# Patient Record
Sex: Female | Born: 1955 | Race: White | Hispanic: No | Smoking: Never smoker
Health system: Southern US, Community
[De-identification: ages and names within clinical notes are randomized; demographics above are authoritative.]

## PROBLEM LIST (undated history)

## (undated) DIAGNOSIS — N179 Acute kidney failure, unspecified: Secondary | ICD-10-CM

## (undated) DIAGNOSIS — F411 Generalized anxiety disorder: Secondary | ICD-10-CM

## (undated) DIAGNOSIS — I10 Essential (primary) hypertension: Secondary | ICD-10-CM

## (undated) DIAGNOSIS — H269 Unspecified cataract: Secondary | ICD-10-CM

## (undated) DIAGNOSIS — I619 Nontraumatic intracerebral hemorrhage, unspecified: Secondary | ICD-10-CM

## (undated) DIAGNOSIS — R000 Tachycardia, unspecified: Secondary | ICD-10-CM

## (undated) DIAGNOSIS — E785 Hyperlipidemia, unspecified: Secondary | ICD-10-CM

## (undated) DIAGNOSIS — T7840XA Allergy, unspecified, initial encounter: Secondary | ICD-10-CM

## (undated) DIAGNOSIS — E669 Obesity, unspecified: Secondary | ICD-10-CM

## (undated) HISTORY — DX: Generalized anxiety disorder: F41.1

## (undated) HISTORY — DX: Acute kidney failure, unspecified: N17.9

## (undated) HISTORY — DX: Essential (primary) hypertension: I10

## (undated) HISTORY — DX: Nontraumatic intracerebral hemorrhage, unspecified: I61.9

## (undated) HISTORY — DX: Unspecified cataract: H26.9

## (undated) HISTORY — DX: Obesity, unspecified: E66.9

## (undated) HISTORY — PX: OTHER SURGICAL HISTORY: SHX169

## (undated) HISTORY — DX: Hyperlipidemia, unspecified: E78.5

## (undated) HISTORY — DX: Tachycardia, unspecified: R00.0

## (undated) HISTORY — PX: TUBAL LIGATION: SHX77

## (undated) HISTORY — DX: Allergy, unspecified, initial encounter: T78.40XA

---

## 1999-07-16 ENCOUNTER — Other Ambulatory Visit: Admission: RE | Admit: 1999-07-16 | Discharge: 1999-07-16 | Payer: Self-pay | Admitting: Obstetrics and Gynecology

## 1999-07-16 ENCOUNTER — Encounter (INDEPENDENT_AMBULATORY_CARE_PROVIDER_SITE_OTHER): Payer: Self-pay | Admitting: Specialist

## 2002-10-10 ENCOUNTER — Other Ambulatory Visit: Admission: RE | Admit: 2002-10-10 | Discharge: 2002-10-10 | Payer: Self-pay | Admitting: Obstetrics and Gynecology

## 2002-10-20 ENCOUNTER — Encounter: Admission: RE | Admit: 2002-10-20 | Discharge: 2002-10-20 | Payer: Self-pay | Admitting: Obstetrics and Gynecology

## 2002-10-20 ENCOUNTER — Encounter: Payer: Self-pay | Admitting: Obstetrics and Gynecology

## 2003-12-21 ENCOUNTER — Other Ambulatory Visit: Admission: RE | Admit: 2003-12-21 | Discharge: 2003-12-21 | Payer: Self-pay | Admitting: Obstetrics and Gynecology

## 2003-12-29 ENCOUNTER — Encounter: Admission: RE | Admit: 2003-12-29 | Discharge: 2003-12-29 | Payer: Self-pay | Admitting: Obstetrics and Gynecology

## 2007-02-09 ENCOUNTER — Other Ambulatory Visit: Admission: RE | Admit: 2007-02-09 | Discharge: 2007-02-09 | Payer: Self-pay | Admitting: Obstetrics and Gynecology

## 2007-02-15 ENCOUNTER — Encounter: Admission: RE | Admit: 2007-02-15 | Discharge: 2007-02-15 | Payer: Self-pay | Admitting: Obstetrics and Gynecology

## 2008-03-28 ENCOUNTER — Other Ambulatory Visit: Admission: RE | Admit: 2008-03-28 | Discharge: 2008-03-28 | Payer: Self-pay | Admitting: Obstetrics and Gynecology

## 2008-04-12 ENCOUNTER — Encounter: Admission: RE | Admit: 2008-04-12 | Discharge: 2008-04-12 | Payer: Self-pay | Admitting: Obstetrics and Gynecology

## 2012-02-02 ENCOUNTER — Emergency Department (HOSPITAL_COMMUNITY)
Admission: EM | Admit: 2012-02-02 | Discharge: 2012-02-02 | Disposition: A | Discharge status: Home / Self Care | Payer: BC Managed Care – PPO | Source: Home / Self Care

## 2012-02-02 ENCOUNTER — Encounter (HOSPITAL_COMMUNITY): Payer: Self-pay | Admitting: *Deleted

## 2012-02-02 DIAGNOSIS — S6440XA Injury of digital nerve of unspecified finger, initial encounter: Secondary | ICD-10-CM

## 2012-02-02 DIAGNOSIS — IMO0002 Reserved for concepts with insufficient information to code with codable children: Secondary | ICD-10-CM

## 2012-02-02 MED ORDER — TETANUS-DIPHTHERIA TOXOIDS TD 5-2 LFU IM INJ
0.5000 mL | INJECTION | Freq: Once | INTRAMUSCULAR | Status: AC
  Administered 2012-02-02: 0.5 mL via INTRAMUSCULAR

## 2012-02-02 MED ORDER — TETANUS-DIPHTHERIA TOXOIDS TD 5-2 LFU IM INJ
INJECTION | INTRAMUSCULAR | Status: AC
  Filled 2012-02-02: qty 0.5

## 2012-02-02 NOTE — ED Notes (Signed)
Reports cutting distal aspect left index finger on piece of broken glass while doing dishes this morning @ 0845.  No active bleeding.  Unk tetanus status.  Reports getting "very sick" after receiving pertussis booster several years ago.

## 2012-02-02 NOTE — Discharge Instructions (Signed)
Thank you for coming in today. Keep the finger clean and dry.  Change the dressing, and use antibiotic ointment with new dressings.  Please come back in about one week for suture removal. Alternatively you can go to your doctor for suture removal.

## 2012-02-02 NOTE — ED Provider Notes (Signed)
Medical screening examination/treatment/procedure(s) were performed by non-physician practitioner and as supervising physician I was immediately available for consultation/collaboration.  Rual Vermeer   Nolton Denis, MD 02/02/12 1948 

## 2012-02-02 NOTE — ED Provider Notes (Signed)
Susan Roman is a 56 y.o. female who presents to Urgent Care today for laceration of the left second fingertip. Patient was washing dishes today when she cut her finger on a broken piece of glass.  She feels well otherwise. She is not sure when her last tetanus shot was. She may have had a reaction to her pertussis shot past when she became ill several hours later.  She denies any chest pain palpitations lightheadedness or vision change.    PMH reviewed. Otherwise healthy History  Substance Use Topics  . Smoking status: Not on file  . Smokeless tobacco: Not on file  . Alcohol Use:    ROS as above Medications reviewed. Current Facility-Administered Medications  Medication Dose Route Frequency Provider Last Rate Last Dose  . tetanus & diphtheria toxoids (adult) (TENIVAC) injection 0.5 mL  0.5 mL Intramuscular Once Rodolph Bong, MD   0.5 mL at 02/02/12 1839   No current outpatient prescriptions on file.    Exam:  BP 106/70  Pulse 95  Temp(Src) 98.6 F (37 C) (Oral)  Resp 16  SpO2 96% Gen: Well NAD LEFT HAND LACERATION: Left second fingertip ulnar side laceration extending from the end of the fingernail approximately 1 cm proximally.  Laceration extends through the dermis.  The laceration bleeds well with exploration.  Normal hand and finger motion and strength.   Procedure note:  Consent obtained and timeout performed. A digital nerve block was applied to the base of the finger using 2 mL of 2% lidocaine without epinephrine.  I waited several minutes and then extensively irrigated and explored the wound.  Then I applied sterile dressing and field.  Using 4-0 Prolene 6 simple interrupted sutures were used to close the wound. A dressing was applied.   During the procedure patient became lightheaded. We laid the patient down and she recovered quickly and felt much better.   No results found for this or any previous visit (from the past 24 hour(s)). No results found.  Assessment  and Plan: 56 y.o. female with left second fingertip laceration.  Repaired with simple interrupted sutures. Plan to followup in one week for suture removals. Tetanus shot given today.  Handout on suture care provided. Patient expresses understanding.    cu  Rodolph Bong, MD 02/02/12 1905

## 2012-02-10 ENCOUNTER — Encounter (HOSPITAL_COMMUNITY): Payer: Self-pay | Admitting: *Deleted

## 2012-02-10 ENCOUNTER — Emergency Department (INDEPENDENT_AMBULATORY_CARE_PROVIDER_SITE_OTHER)
Admission: EM | Admit: 2012-02-10 | Discharge: 2012-02-10 | Disposition: A | Discharge status: Home / Self Care | Payer: Self-pay | Source: Home / Self Care | Attending: Emergency Medicine | Admitting: Emergency Medicine

## 2012-02-10 DIAGNOSIS — Z4802 Encounter for removal of sutures: Secondary | ICD-10-CM

## 2012-02-10 NOTE — ED Notes (Signed)
herte  For  Suture  Removal     -  Sutures  Have  Been in place  For  8  Days       - pt  Also  Received  A  Tet  Shot  -  Sutures  Appear  To be  Healing

## 2012-02-10 NOTE — Discharge Instructions (Signed)
Suture Removal  Your caregiver has removed your sutures today. If skin adhesive strips were applied at the time of suturing, or applied following removal of the sutures today, they will begin to peel off in a couple more days. If skin adhesive strips remain after 14 days, they may be removed.  HOME CARE INSTRUCTIONS     Change any bandages (dressings) at least once a day or as directed by your caregiver. If the bandage sticks, soak it off with warm, soapy water.    Wash the area with soap and water to remove all the cream or ointment (if you were instructed to use any) 2 times a day. Rinse off the soap and pat the area dry with a clean towel.    Reapply cream or ointment as directed by your caregiver. This will help prevent infection and keep the bandage from sticking.    Keep the wound area dry and clean. If the bandage becomes wet, dirty, or develops a bad smell, change it as soon as possible.    Only take over-the-counter or prescription medicines for pain, discomfort, or fever as directed by your caregiver.    Use sunscreen when out in the sun. New scars become sunburned easily.    Return to your caregivers office in in 7 days or as directed to have your sutures removed.   You may need a tetanus shot if:   You cannot remember when you had your last tetanus shot.    You have never had a tetanus shot.    The injury broke your skin.   If you got a tetanus shot, your arm may swell, get red, and feel warm to the touch. This is common and not a problem. If you need a tetanus shot and you choose not to have one, there is a rare chance of getting tetanus. Sickness from tetanus can be serious.  SEEK IMMEDIATE MEDICAL CARE IF:     There is redness, swelling, or increasing pain in the wound.    Pus is coming from the wound.    An unexplained oral temperature above 102 F (38.9 C) develops.    You notice a bad smell coming from the wound or dressing.     The wound breaks open (edges not staying together) after sutures have been removed.   Document Released: 05/13/2001 Document Revised: 08/07/2011 Document Reviewed: 07/12/2007  Exit[employer] Patient Information 2012 [employer].

## 2012-02-10 NOTE — ED Provider Notes (Signed)
History     CSN: [id number]  Arrival date & time 02/10/12  1801   First MD Initiated Contact with Patient 02/10/12 1832      Chief Complaint  Patient presents with  . Suture / Staple Removal    (Consider location/radiation/quality/duration/timing/severity/associated sxs/prior treatment) Patient is a 56 y.o. female presenting with suture removal. The history is provided by the patient.  Suture / Staple Removal  The sutures were placed 7 to 10 days ago. Treatments since wound repair include antibiotic ointment use. There has been no drainage from the wound. There is no redness present. There is no swelling present.   patient denies any complaints  No past medical history on file.  Past Surgical History  Procedure Date  . Fibroid tumor     No family history on file.  History  Substance Use Topics  . Smoking status: Not on file  . Smokeless tobacco: Not on file  . Alcohol Use:     OB History    Grav Para Term Preterm Abortions TAB SAB Ect Mult Living                  Review of Systems  All other systems reviewed and are negative.    Allergies  Review of patient's allergies indicates no known allergies.  Home Medications  No current outpatient prescriptions on file.  BP 179/100  Pulse 90  Temp(Src) 98.9 F (37.2 C) (Oral)  Resp 18  SpO2 100%  Physical Exam  Vitals reviewed. Constitutional: She is oriented to person, place, and time. She appears well-developed and well-nourished.  Musculoskeletal: Normal range of motion.       Healing laceration left second finger tip no sign of infection  Neurological: She is alert and oriented to person, place, and time.  Skin: Skin is warm and dry.  Psychiatric: She has a normal mood and affect.    ED Course  Procedures (including critical care time)  Labs Reviewed - No data to display No results found.   1. Visit for suture removal       MDM  Sutures removed by Emt patient advised to return if any  problems        Elson Areas, [place] 02/10/12 1834  Lonia Skinner [place], [place] 02/10/12 1836

## 2012-02-10 NOTE — ED Provider Notes (Signed)
Medical screening examination/treatment/procedure(s) were performed by non-physician practitioner and as supervising physician I was immediately available for consultation/collaboration.  Pamalee Marcoe, M.D.   Jeziah Kretschmer C Alvar Malinoski, MD 02/10/12 2125 

## 2018-03-29 ENCOUNTER — Encounter: Payer: Self-pay | Admitting: Gastroenterology

## 2018-04-22 ENCOUNTER — Other Ambulatory Visit: Payer: Self-pay | Admitting: Internal Medicine

## 2018-04-22 DIAGNOSIS — Z1231 Encounter for screening mammogram for malignant neoplasm of breast: Secondary | ICD-10-CM

## 2018-05-07 ENCOUNTER — Encounter: Payer: Self-pay | Admitting: *Deleted

## 2018-05-13 ENCOUNTER — Encounter: Payer: Self-pay | Admitting: Gastroenterology

## 2018-05-18 ENCOUNTER — Ambulatory Visit
Admission: RE | Admit: 2018-05-18 | Discharge: 2018-05-18 | Disposition: A | Discharge status: Home / Self Care | Payer: BLUE CROSS/BLUE SHIELD | Source: Ambulatory Visit | Attending: Internal Medicine | Admitting: Internal Medicine

## 2018-05-18 DIAGNOSIS — Z1231 Encounter for screening mammogram for malignant neoplasm of breast: Secondary | ICD-10-CM

## 2018-06-07 ENCOUNTER — Encounter: Payer: Self-pay | Admitting: *Deleted

## 2018-06-07 ENCOUNTER — Telehealth: Payer: Self-pay | Admitting: *Deleted

## 2018-06-07 NOTE — Telephone Encounter (Signed)
Pt left message today stating that she had been on hold with our office for over 30 minutes. She wants to cancel her appt on 10/9. I responded to pt via MyChart.

## 2018-06-09 ENCOUNTER — Ambulatory Visit: Payer: Self-pay | Admitting: Obstetrics and Gynecology

## 2018-07-05 ENCOUNTER — Other Ambulatory Visit (HOSPITAL_COMMUNITY)
Admission: RE | Admit: 2018-07-05 | Discharge: 2018-07-05 | Disposition: A | Discharge status: Home / Self Care | Payer: BLUE CROSS/BLUE SHIELD | Source: Ambulatory Visit | Attending: Physician Assistant | Admitting: Physician Assistant

## 2018-07-05 ENCOUNTER — Other Ambulatory Visit: Payer: Self-pay | Admitting: Physician Assistant

## 2018-07-05 DIAGNOSIS — Z0000 Encounter for general adult medical examination without abnormal findings: Secondary | ICD-10-CM | POA: Insufficient documentation

## 2018-07-06 ENCOUNTER — Other Ambulatory Visit: Payer: Self-pay | Admitting: Physician Assistant

## 2018-07-06 DIAGNOSIS — Z13820 Encounter for screening for osteoporosis: Secondary | ICD-10-CM

## 2018-07-07 LAB — CYTOLOGY - PAP
Diagnosis: NEGATIVE
HPV: NOT DETECTED

## 2018-07-26 ENCOUNTER — Other Ambulatory Visit: Payer: BLUE CROSS/BLUE SHIELD

## 2018-08-09 ENCOUNTER — Ambulatory Visit
Admission: RE | Admit: 2018-08-09 | Discharge: 2018-08-09 | Disposition: A | Discharge status: Home / Self Care | Payer: PRIVATE HEALTH INSURANCE | Source: Ambulatory Visit | Attending: Physician Assistant | Admitting: Physician Assistant

## 2018-08-09 DIAGNOSIS — Z13820 Encounter for screening for osteoporosis: Secondary | ICD-10-CM

## 2019-05-03 IMAGING — MG DIGITAL SCREENING BILATERAL MAMMOGRAM WITH TOMO AND CAD
8 series · 8 of 24 positions shown · non-contrast
Comparison: Previous exam(s).

CLINICAL DATA: Screening.

EXAM:
DIGITAL SCREENING BILATERAL MAMMOGRAM WITH TOMO AND CAD

[R MLO synth-2D]
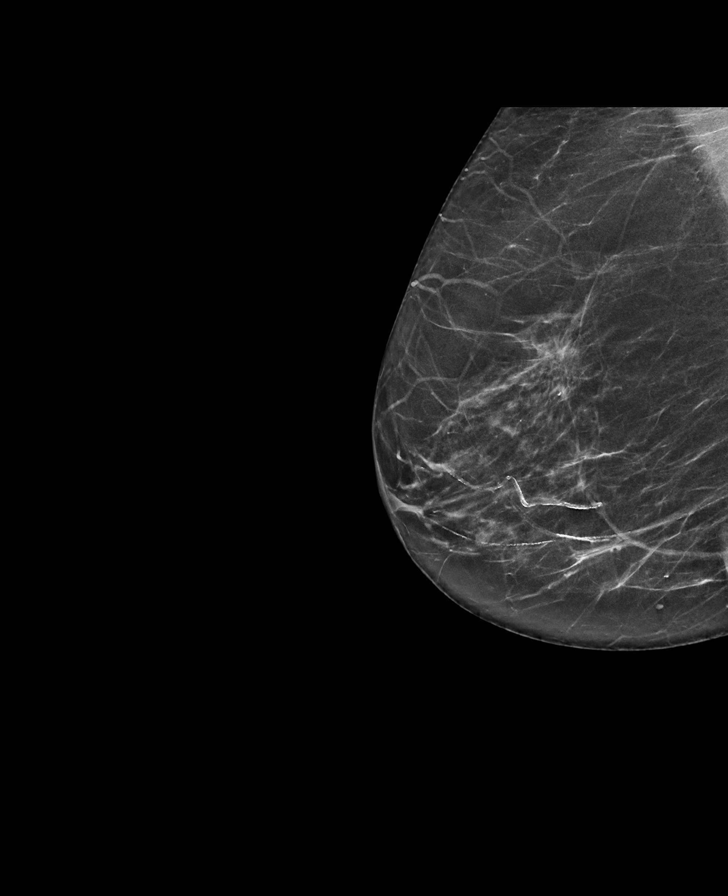

[L CC synth-2D]
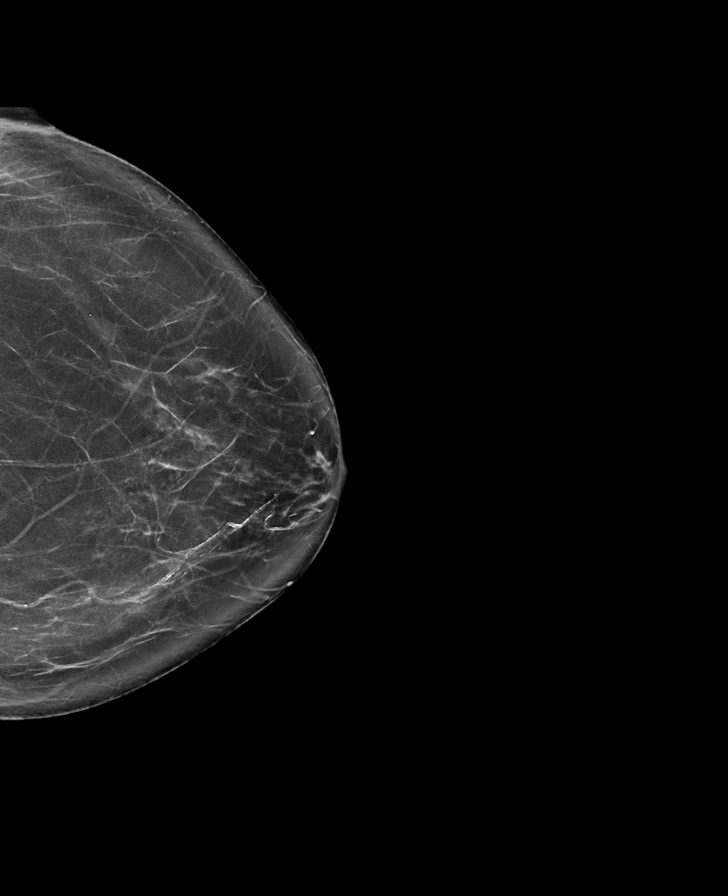

[L MLO synth-2D]
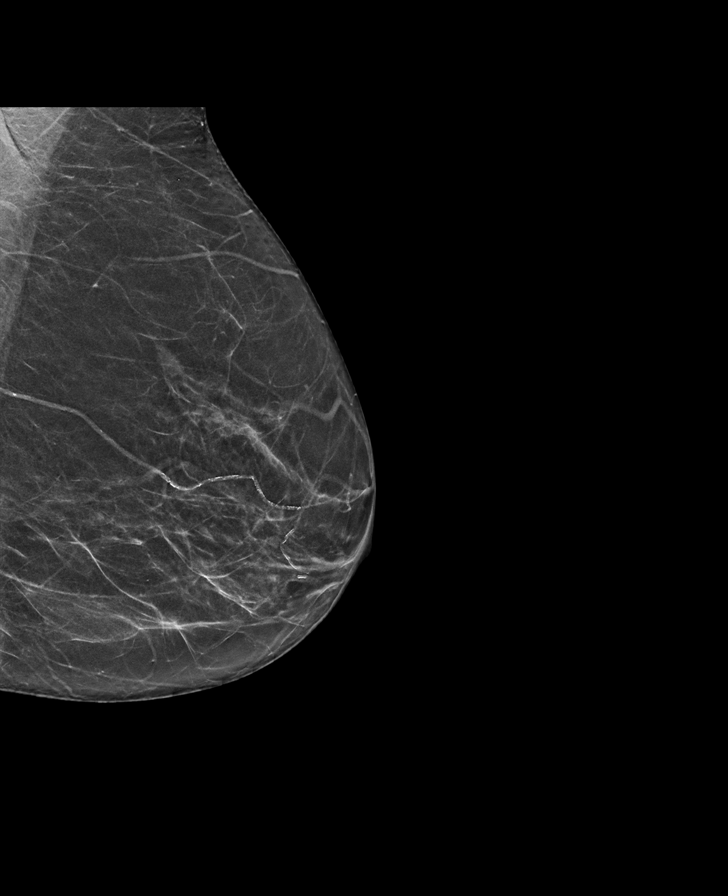

[R CC synth-2D]
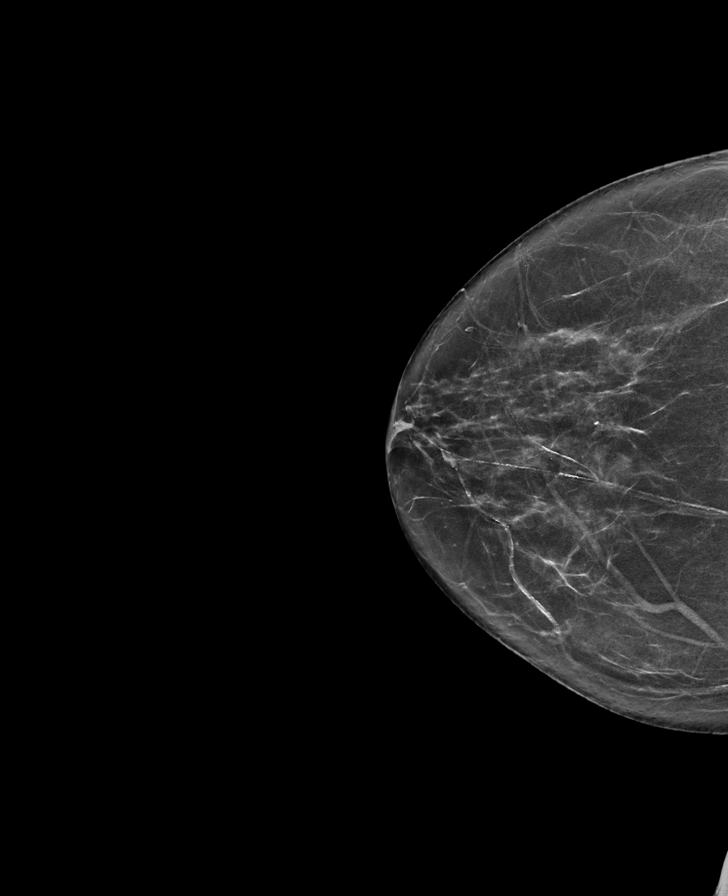

[L MLO tomo · tomo slice 31/62.0]
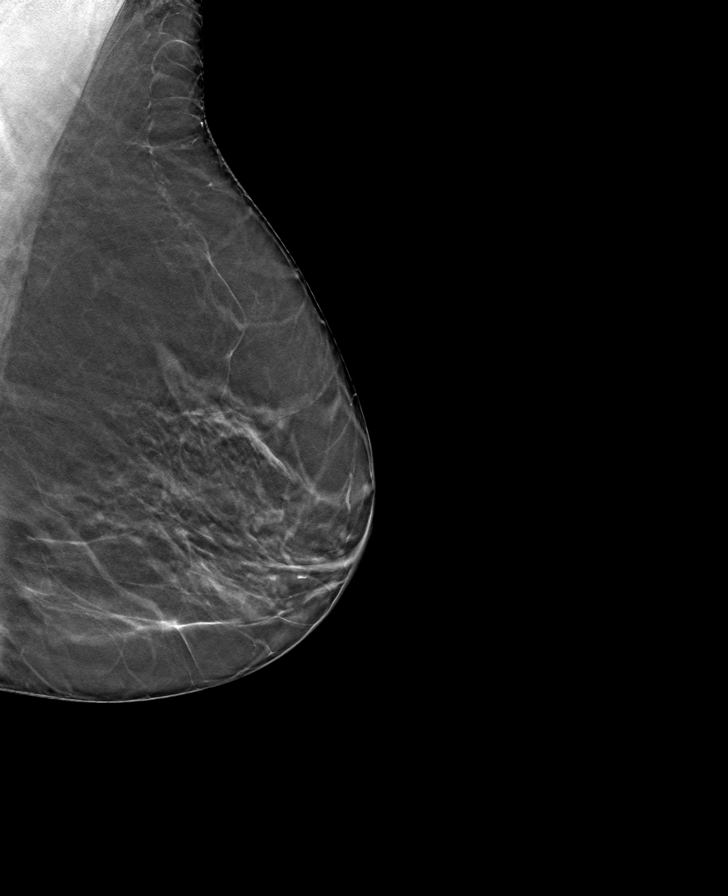

[R CC tomo · tomo slice 33/65.0]
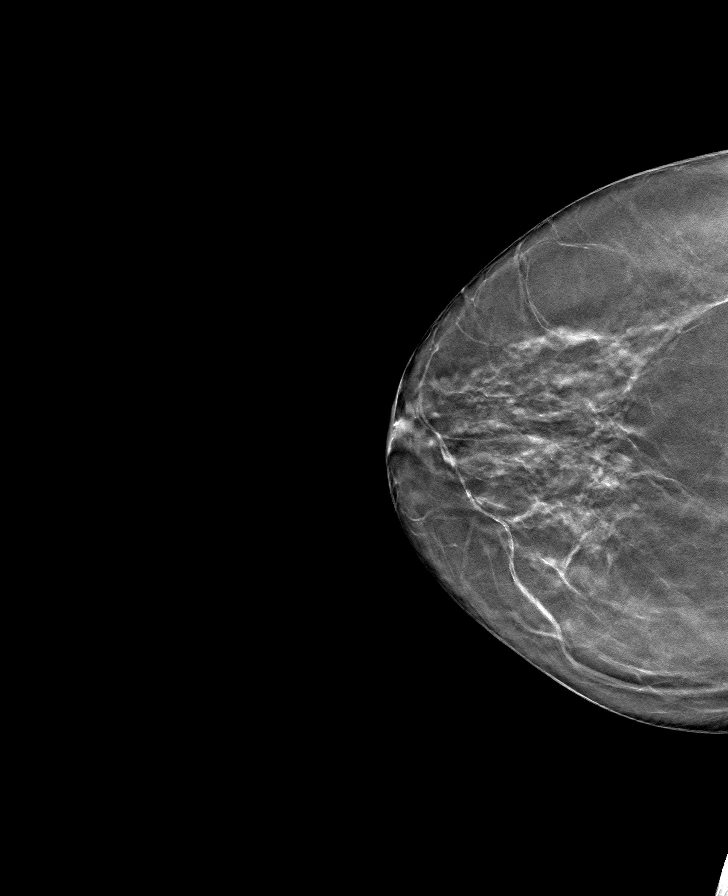

[R MLO tomo · tomo slice 33/64.0]
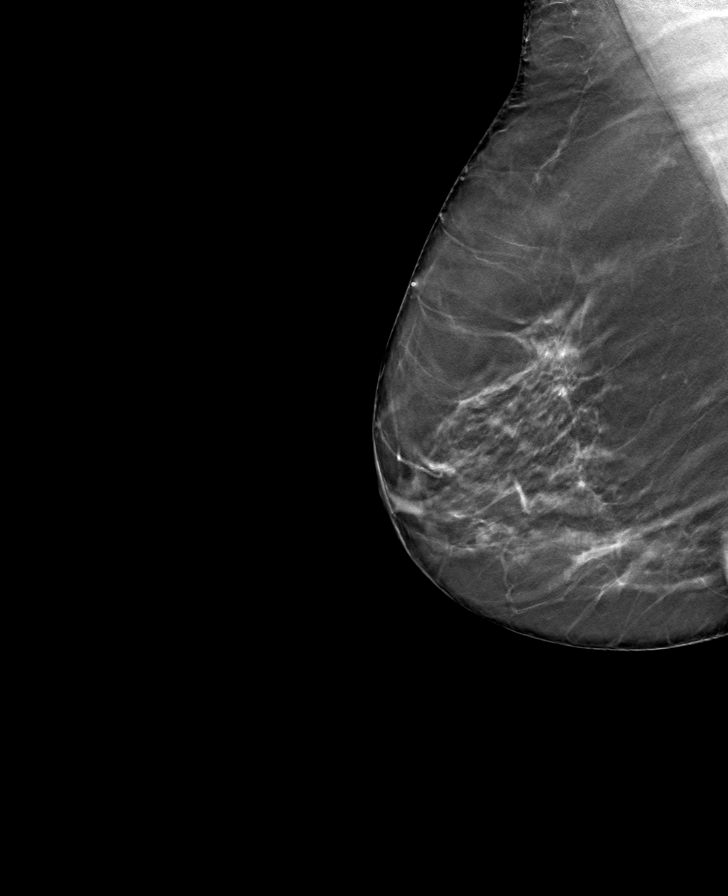

[L CC tomo · tomo slice 36/71.0]
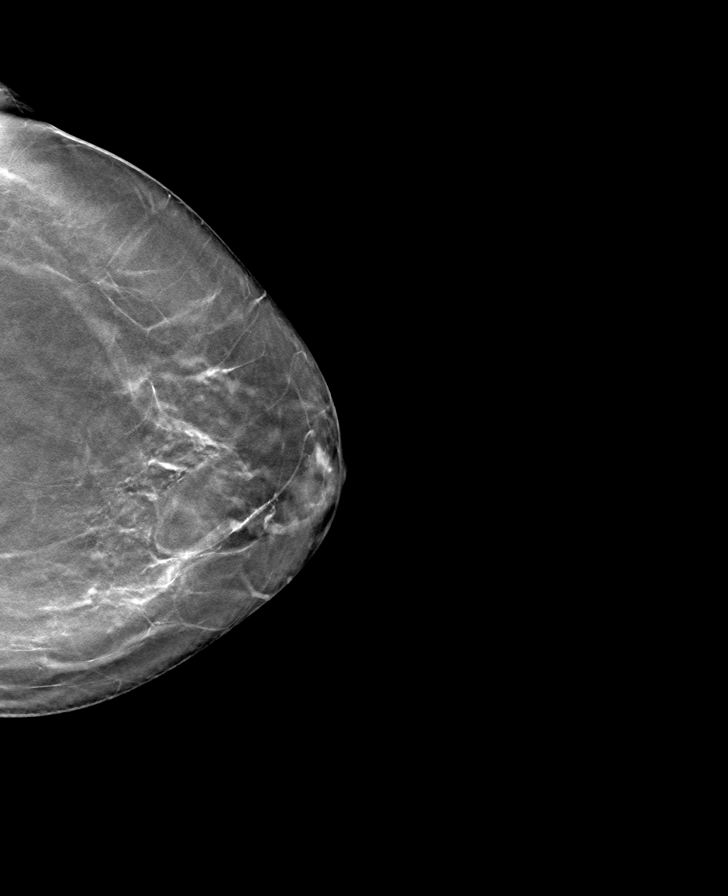

[8 of 24 positions shown; findings below may reference images not displayed]

ACR Breast Density Category b: There are scattered areas of
fibroglandular density.
FINDINGS: There are no findings suspicious for malignancy. Images were
processed with CAD.
IMPRESSION: No mammographic evidence of malignancy. A result letter of this
screening mammogram will be mailed directly to the patient.

RECOMMENDATION:
Screening mammogram in one year. (Code:CN-U-775)

BI-RADS CATEGORY  1: Negative.

## 2019-12-30 ENCOUNTER — Ambulatory Visit: Payer: PRIVATE HEALTH INSURANCE

## 2020-10-23 DIAGNOSIS — L570 Actinic keratosis: Secondary | ICD-10-CM | POA: Diagnosis not present

## 2020-10-23 DIAGNOSIS — L738 Other specified follicular disorders: Secondary | ICD-10-CM | POA: Diagnosis not present

## 2020-10-23 DIAGNOSIS — L821 Other seborrheic keratosis: Secondary | ICD-10-CM | POA: Diagnosis not present

## 2020-10-23 DIAGNOSIS — L814 Other melanin hyperpigmentation: Secondary | ICD-10-CM | POA: Diagnosis not present

## 2021-01-16 DIAGNOSIS — I10 Essential (primary) hypertension: Secondary | ICD-10-CM | POA: Diagnosis not present

## 2021-05-23 DIAGNOSIS — I10 Essential (primary) hypertension: Secondary | ICD-10-CM | POA: Diagnosis not present

## 2021-07-02 DIAGNOSIS — D225 Melanocytic nevi of trunk: Secondary | ICD-10-CM | POA: Diagnosis not present

## 2021-07-02 DIAGNOSIS — L820 Inflamed seborrheic keratosis: Secondary | ICD-10-CM | POA: Diagnosis not present

## 2021-07-02 DIAGNOSIS — D1801 Hemangioma of skin and subcutaneous tissue: Secondary | ICD-10-CM | POA: Diagnosis not present

## 2021-07-02 DIAGNOSIS — L821 Other seborrheic keratosis: Secondary | ICD-10-CM | POA: Diagnosis not present

## 2021-07-02 DIAGNOSIS — L570 Actinic keratosis: Secondary | ICD-10-CM | POA: Diagnosis not present

## 2024-06-25 ENCOUNTER — Inpatient Hospital Stay (HOSPITAL_COMMUNITY)

## 2024-06-25 ENCOUNTER — Other Ambulatory Visit: Payer: Self-pay

## 2024-06-25 ENCOUNTER — Encounter (HOSPITAL_COMMUNITY): Payer: Self-pay | Admitting: Pharmacy Technician

## 2024-06-25 ENCOUNTER — Inpatient Hospital Stay (HOSPITAL_COMMUNITY)
Admission: EM | Admit: 2024-06-25 | Discharge: 2024-06-28 | DRG: 304 | Disposition: A | Discharge status: Home / Self Care | Attending: Neurology | Admitting: Neurology

## 2024-06-25 ENCOUNTER — Emergency Department (HOSPITAL_COMMUNITY)

## 2024-06-25 DIAGNOSIS — I739 Peripheral vascular disease, unspecified: Secondary | ICD-10-CM | POA: Diagnosis not present

## 2024-06-25 DIAGNOSIS — I10 Essential (primary) hypertension: Secondary | ICD-10-CM | POA: Diagnosis present

## 2024-06-25 DIAGNOSIS — I619 Nontraumatic intracerebral hemorrhage, unspecified: Secondary | ICD-10-CM

## 2024-06-25 DIAGNOSIS — I161 Hypertensive emergency: Secondary | ICD-10-CM | POA: Diagnosis present

## 2024-06-25 DIAGNOSIS — E785 Hyperlipidemia, unspecified: Secondary | ICD-10-CM | POA: Insufficient documentation

## 2024-06-25 DIAGNOSIS — R000 Tachycardia, unspecified: Secondary | ICD-10-CM | POA: Diagnosis not present

## 2024-06-25 DIAGNOSIS — Z23 Encounter for immunization: Secondary | ICD-10-CM | POA: Diagnosis present

## 2024-06-25 DIAGNOSIS — G936 Cerebral edema: Secondary | ICD-10-CM | POA: Diagnosis present

## 2024-06-25 DIAGNOSIS — N179 Acute kidney failure, unspecified: Secondary | ICD-10-CM | POA: Insufficient documentation

## 2024-06-25 DIAGNOSIS — I471 Supraventricular tachycardia, unspecified: Secondary | ICD-10-CM | POA: Diagnosis present

## 2024-06-25 DIAGNOSIS — Z635 Disruption of family by separation and divorce: Secondary | ICD-10-CM | POA: Diagnosis not present

## 2024-06-25 DIAGNOSIS — I611 Nontraumatic intracerebral hemorrhage in hemisphere, cortical: Secondary | ICD-10-CM | POA: Diagnosis present

## 2024-06-25 DIAGNOSIS — R4701 Aphasia: Secondary | ICD-10-CM | POA: Diagnosis present

## 2024-06-25 DIAGNOSIS — I6389 Other cerebral infarction: Secondary | ICD-10-CM | POA: Diagnosis not present

## 2024-06-25 DIAGNOSIS — R29703 NIHSS score 3: Secondary | ICD-10-CM | POA: Diagnosis present

## 2024-06-25 DIAGNOSIS — I639 Cerebral infarction, unspecified: Secondary | ICD-10-CM

## 2024-06-25 DIAGNOSIS — H53461 Homonymous bilateral field defects, right side: Secondary | ICD-10-CM | POA: Diagnosis present

## 2024-06-25 HISTORY — DX: Cerebral infarction, unspecified: I63.9

## 2024-06-25 HISTORY — DX: Nontraumatic intracerebral hemorrhage, unspecified: I61.9

## 2024-06-25 LAB — COMPREHENSIVE METABOLIC PANEL WITH GFR
ALT: 14 U/L (ref 0–44)
AST: 22 U/L (ref 15–41)
Albumin: 4 g/dL (ref 3.5–5.0)
Alkaline Phosphatase: 77 U/L (ref 38–126)
Anion gap: 8 (ref 5–15)
BUN: 13 mg/dL (ref 8–23)
CO2: 27 mmol/L (ref 22–32)
Calcium: 9.1 mg/dL (ref 8.9–10.3)
Chloride: 105 mmol/L (ref 98–111)
Creatinine, Ser: 1.06 mg/dL — ABNORMAL HIGH (ref 0.44–1.00)
GFR, Estimated: 57 mL/min — ABNORMAL LOW (ref >60)
Glucose, Bld: 140 mg/dL — ABNORMAL HIGH (ref 70–99)
Potassium: 4.9 mmol/L (ref 3.5–5.1)
Sodium: 140 mmol/L (ref 135–145)
Total Bilirubin: 0.9 mg/dL (ref 0.0–1.2)
Total Protein: 6.6 g/dL (ref 6.5–8.1)

## 2024-06-25 LAB — CBC
HCT: 42.9 % (ref 36.0–46.0)
Hemoglobin: 14.1 g/dL (ref 12.0–15.0)
MCH: 31.4 pg (ref 26.0–34.0)
MCHC: 32.9 g/dL (ref 30.0–36.0)
MCV: 95.5 fL (ref 80.0–100.0)
Platelets: 245 K/uL (ref 150–400)
RBC: 4.49 MIL/uL (ref 3.87–5.11)
RDW: 12.5 % (ref 11.5–15.5)
WBC: 7.9 K/uL (ref 4.0–10.5)
nRBC: 0 % (ref 0.0–0.2)

## 2024-06-25 LAB — URINALYSIS, ROUTINE W REFLEX MICROSCOPIC
Bilirubin Urine: NEGATIVE
Glucose, UA: NEGATIVE mg/dL
Hgb urine dipstick: NEGATIVE
Ketones, ur: NEGATIVE mg/dL
Leukocytes,Ua: NEGATIVE
Nitrite: NEGATIVE
Protein, ur: NEGATIVE mg/dL
Specific Gravity, Urine: 1.002 — ABNORMAL LOW (ref 1.005–1.030)
pH: 6 (ref 5.0–8.0)

## 2024-06-25 LAB — I-STAT CHEM 8, ED
BUN: 14 mg/dL (ref 8–23)
Calcium, Ion: 1.11 mmol/L — ABNORMAL LOW (ref 1.15–1.40)
Chloride: 105 mmol/L (ref 98–111)
Creatinine, Ser: 1.1 mg/dL — ABNORMAL HIGH (ref 0.44–1.00)
Glucose, Bld: 103 mg/dL — ABNORMAL HIGH (ref 70–99)
HCT: 40 % (ref 36.0–46.0)
Hemoglobin: 13.6 g/dL (ref 12.0–15.0)
Potassium: 3.7 mmol/L (ref 3.5–5.1)
Sodium: 144 mmol/L (ref 135–145)
TCO2: 27 mmol/L (ref 22–32)

## 2024-06-25 LAB — CBG MONITORING, ED: Glucose-Capillary: 150 mg/dL — ABNORMAL HIGH (ref 70–99)

## 2024-06-25 MED ORDER — ACETAMINOPHEN 325 MG PO TABS: 650.0000 mg | ORAL_TABLET | ORAL | Status: DC | PRN

## 2024-06-25 MED ORDER — IOHEXOL 350 MG/ML SOLN
75.0000 mL | Freq: Once | INTRAVENOUS | Status: AC | PRN
  Administered 2024-06-25: 75 mL via INTRAVENOUS

## 2024-06-25 MED ORDER — SENNOSIDES-DOCUSATE SODIUM 8.6-50 MG PO TABS
1.0000 | ORAL_TABLET | Freq: Two times a day (BID) | ORAL | Status: DC
  Administered 2024-06-25 – 2024-06-26 (×2): 1 via ORAL
  Filled 2024-06-25 (×3): qty 1

## 2024-06-25 MED ORDER — STROKE: EARLY STAGES OF RECOVERY BOOK
Freq: Once | Status: AC
  Filled 2024-06-25: qty 1

## 2024-06-25 MED ORDER — INFLUENZA VAC SPLIT HIGH-DOSE 0.5 ML IM SUSY: 0.5000 mL | PREFILLED_SYRINGE | INTRAMUSCULAR | Status: DC | PRN

## 2024-06-25 MED ORDER — CLEVIDIPINE BUTYRATE 0.5 MG/ML IV EMUL
0.0000 mg/h | INTRAVENOUS | Status: DC
  Administered 2024-06-25: 2 mg/h via INTRAVENOUS
  Administered 2024-06-25: 8 mg/h via INTRAVENOUS
  Administered 2024-06-25 (×2): 12 mg/h via INTRAVENOUS
  Administered 2024-06-25: 4 mg/h via INTRAVENOUS
  Filled 2024-06-25: qty 100
  Filled 2024-06-25: qty 50

## 2024-06-25 MED ORDER — ACETAMINOPHEN 160 MG/5ML PO SOLN: 650.0000 mg | ORAL | Status: DC | PRN

## 2024-06-25 MED ORDER — LABETALOL HCL 5 MG/ML IV SOLN
20.0000 mg | Freq: Once | INTRAVENOUS | Status: AC
  Administered 2024-06-25: 20 mg via INTRAVENOUS
  Filled 2024-06-25: qty 4

## 2024-06-25 MED ORDER — PNEUMOCOCCAL 20-VAL CONJ VACC 0.5 ML IM SUSY
0.5000 mL | PREFILLED_SYRINGE | INTRAMUSCULAR | Status: AC | PRN
  Administered 2024-06-26: 0.5 mL via INTRAMUSCULAR
  Filled 2024-06-25: qty 0.5

## 2024-06-25 MED ORDER — NICARDIPINE HCL IN NACL 20-0.86 MG/200ML-% IV SOLN
0.0000 mg/h | INTRAVENOUS | Status: DC
  Filled 2024-06-25: qty 200

## 2024-06-25 MED ORDER — ACETAMINOPHEN 650 MG RE SUPP: 650.0000 mg | RECTAL | Status: DC | PRN

## 2024-06-25 MED ORDER — PANTOPRAZOLE SODIUM 40 MG IV SOLR
40.0000 mg | Freq: Every day | INTRAVENOUS | Status: DC
  Administered 2024-06-26: 40 mg via INTRAVENOUS
  Filled 2024-06-25: qty 10

## 2024-06-25 NOTE — Progress Notes (Signed)
 This SRN was contact by Neuro MD in assisting ED RN with BP management. SBP Goal 130-150. Cleviprex titrated per order. Patients family expressed concern over worsening forgetfulness; This SRN's assessment showed worsening aphasia. Neuro MD ordered stat CT Head. This RN assisted ED RN to CT and back to patients room.   Patient was transferred to 4N ICU when bed was ready. This SRN assessed patient and no increase in NIH was noted.

## 2024-06-25 NOTE — H&P (Addendum)
 NEUROLOGY H&P NOTE   Date of service: June 25, 2024 Patient Name: Susan Roman MRN:  [id number] DOB:  [birth year] Chief Complaint: Left posterior MCA ICH  History of Present Illness  Susan Roman is a 68 y.o. female with hx of hypertension, does not see doctors regularly who presents with word finding difficulty over the last day.  She has been noticing the symptoms since at least yesterday morning.  She was noted to be hypertensive on arrival to the ED with systolic consistently over 200s.  She had a CT head without contrast which was notable for a posterior left temporal and parietal lobe intraparenchymal hemorrhage measuring about 5.1 mL with surrounding mild vasogenic edema.  CT angio the head and neck is negative for large vessel occlusion or vascular malformation or aneurysm or spot sign.  Neurology was consulted for further evaluation workup.  Patient is not on any anticoagulation or on any antiplatelet.  Last known well: Unclear.  Presume at least early morning on 06/20/2024 Modified rankin score: 0-Completely asymptomatic and back to baseline post- stroke ICH Score: 0 tNKASE: Not offered due to ICH Thrombectomy: not offered due to ICH NIHSS components Score: Comment  1a Level of Conscious 0[x]  1[]  2[]  3[]      1b LOC Questions 0[x]  1[]  2[]       1c LOC Commands 0[x]  1[]  2[]       2 Best Gaze 0[x]  1[]  2[]       3 Visual 0[]  1[]  2[x]  3[]      4 Facial Palsy 0[x]  1[]  2[]  3[]      5a Motor Arm - left 0[x]  1[]  2[]  3[]  4[]  UN[]    5b Motor Arm - Right 0[x]  1[]  2[]  3[]  4[]  UN[]    6a Motor Leg - Left 0[x]  1[]  2[]  3[]  4[]  UN[]    6b Motor Leg - Right 0[x]  1[]  2[]  3[]  4[]  UN[]    7 Limb Ataxia 0[x]  1[]  2[]  UN[]      8 Sensory 0[x]  1[]  2[]  UN[]      9 Best Language 0[]  1[x]  2[]  3[]      10 Dysarthria 0[x]  1[]  2[]  UN[]      11 Extinct. and Inattention 0[x]  1[]  2[]       TOTAL: 3      ROS  Comprehensive ROS performed and pertinent positives documented in the HPI.  Past History   History reviewed. No pertinent past medical history. Past Surgical History:  Procedure Laterality Date   fibroid tumor     History reviewed. No pertinent family history. Social History   Socioeconomic History   Marital status: Legally Separated    Spouse name: Not on file   Number of children: Not on file   Years of education: Not on file   Highest education level: Not on file  Occupational History   Not on file  Tobacco Use   Smoking status: Not on file   Smokeless tobacco: Not on file  Substance and Sexual Activity   Alcohol use: Not on file   Drug use: No   Sexual activity: Not on file  Other Topics Concern   Not on file  Social History Narrative   Not on file   Social Drivers of Health   Financial Resource Strain: Not on file  Food Insecurity: Not on file  Transportation Needs: Not on file  Physical Activity: Not on file  Stress: Not on file  Social Connections: Not on file   No Known Allergies  Medications  (Not in a hospital admission)    Vitals  Vitals:   06/25/24 1300 06/25/24 1400 06/25/24 1440 06/25/24 1450  BP: (!) 202/109 (!) 197/103 (!) 203/97 (!) 197/109  Pulse: (!) 113 (!) 113 96 94  Resp: 16 16 16 16   Temp:      TempSrc:      SpO2: 100% 98% 98% 97%     There is no height or weight on file to calculate BMI.  Physical Exam   General: Laying comfortably in bed; in no acute distress.  HENT: Normal oropharynx and mucosa. Normal external appearance of ears and nose.  Neck: Supple, no pain or tenderness  CV: No JVD. No peripheral edema.  Pulmonary: Symmetric Chest rise. Normal respiratory effort.  Abdomen: Soft to touch, non-tender.  Ext: No cyanosis, edema, or deformity  Skin: No rash. Normal palpation of skin.   Musculoskeletal: Normal digits and nails by inspection. No clubbing.   Neurologic Examination  Mental status/Cognition: Alert, oriented to self, place, month and year, good attention.  Speech/language: Fluent, comprehension  intact, get stuck on some words and makes paraphasic errors, repetition intact.  Cranial nerves:   CN II Pupils equal and reactive to light, right hemianopsia   CN III,IV,VI EOM intact, no gaze preference or deviation, no nystagmus    CN V normal sensation in V1, V2, and V3 segments bilaterally    CN VII no asymmetry, no nasolabial fold flattening    CN VIII normal hearing to speech    CN IX & X normal palatal elevation, no uvular deviation    CN XI 5/5 head turn and 5/5 shoulder shrug bilaterally    CN XII midline tongue protrusion    Motor:  Muscle bulk: normal, tone normal, pronator drift none tremor none Mvmt Root Nerve  Muscle Right Left Comments  SA C5/6 Ax Deltoid 5 5   EF C5/6 Mc Biceps 5 5   EE C6/7/8 Rad Triceps 5 5   WF C6/7 Med FCR     WE C7/8 PIN ECU     F Ab C8/T1 U ADM/FDI 5 5   HF L1/2/3 Fem Illopsoas 5 5   KE L2/3/4 Fem Quad 5 5   DF L4/5 D Peron Tib Ant 5 5   PF S1/2 Tibial Grc/Sol 5 5    Sensation:  Light touch Intact throughout   Pin prick    Temperature    Vibration   Proprioception    Coordination/Complex Motor:  - Finger to Nose intact bilaterally - Heel to shin intact bilaterally - Rapid alternating movement are normal - Gait: Deferred the patient safety. Labs   CBC:  Recent Labs  Lab 06/25/24 1229 06/25/24 1344  WBC 7.9  --   HGB 14.1 13.6  HCT 42.9 40.0  MCV 95.5  --   PLT 245  --     Basic Metabolic Panel:  Lab Results  Component Value Date   NA 144 06/25/2024   K 3.7 06/25/2024   CO2 27 06/25/2024   GLUCOSE 103 (H) 06/25/2024   BUN 14 06/25/2024   CREATININE 1.10 (H) 06/25/2024   CALCIUM 9.1 06/25/2024   GFRNONAA 57 (L) 06/25/2024   Lipid Panel: No results found for: LDLCALC HgbA1c: No results found for: HGBA1C Urine Drug Screen: No results found for: LABOPIA, COCAINSCRNUR, LABBENZ, AMPHETMU, THCU, LABBARB  Alcohol Level No results found for: ETH INR No results found for: INR APTT No results found for:  APTT   CT Head without contrast(Personally reviewed): 1. Focal hemorrhage in the posterior left temporal and parietal lobe  measuring 2.2 x 2.6 x 1.8 cm with estimated volume of approximately 5.1 mL and mild surrounding vasogenic edema. No associated spot sign or focal vascular lesion.  CT angio Head and Neck with contrast(Personally reviewed): Atherosclerotic calcifications within the cavernous internal carotid arteries bilaterally without significant stenoses relative to the ICA terminus. Moderate tortuosity in the right internal carotid artery just below the skull base.  MRI Brain: Pending  Impression   Susan Roman is a 68 y.o. female with hx of hypertension, does not see doctors regularly who presents with word finding difficulty over the last day.  She has been noticing the symptoms since at least yesterday morning.  She was noted to be hypertensive on arrival to the ED with systolic consistently over 200s.  She is found to have acute left temporal parietal ICH about 5.1 mL with mild surrounding vasogenic edema with an ICH score of 0.  Etiology of her ICH is unclear.  It may be that this is a hemorrhagic transformation of his stroke versus a primary hemorrhage but the location would be odd for this to be a primary hemorrhage.  Could be that she has underlying cerebral amyloid angiopathy or a vascular malformation or possibly underlying tumor.  Primary Diagnosis:  Nontraumatic intracerebral hemorrhage in hemisphere, cortical  Secondary Diagnosis: Hypertension Emergency (SBP > 180 or DBP > 120 & end organ damage)  Recommendations  Acute left temporoparietal ICH about 5.1 mL with mild surrounding vasogenic edema: - Admit to ICU - Stability scan in 6 hours or STAT with any neurological decline - Frequent neuro checks; q12min for 1 hour, then q1hour - No antiplatelets or anticoagulants due to ICH - SCD for DVT prophylaxis, pharmacological DVT ppx at 24 hours if ICH is  stable - Blood pressure control with goal systolic 130 - 150, cleverplex and labetalol PRN - Stroke labs, HgbA1c, fasting lipid panel - MRI brain with and without contrast when stabilized to evaluate for underlying mass - MRA without contrast of the brain and Vasc US  carotid duplex to evaluate for underlying vascular abnormality. - Risk factor modification - Echocardiogram - PT consult, OT consult, Speech consult. 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  Hypertensive emergency: - Goal SBP is between 130s to 150s systolic. - Will use as needed labetalol and Cleviprex for SBP above goal.  Allergies verified with patient and patient has no known allergies.  CODE STATUS was verified with patient and patient is full code.  Patient would like her daughter Susan Roman to be her healthcare power of attorney in case if she is unable to make medical decisions by herself.  Daughter's information is in the chart and she is listed the primary emergency contact now.  This patient is critically ill and at significant risk of neurological worsening, death and care requires constant monitoring of vital signs, hemodynamics,respiratory and cardiac monitoring, neurological assessment, discussion with family, other specialists and medical decision making of high complexity. I spent 70 minutes of neurocritical care time  in the care of  this patient. This was time spent independent of any time provided by nurse practitioner or PA.  Susan Roman [facility] 06/25/2024  3:06 PM   Update: 4:22 PM Was notified of worsening aphasia by Stroke response RN. STAT CT head obtained and reviewed and ICH is stable in size. ______________________________________________________________________   Signed,  Susan Helmes, MD [facility]

## 2024-06-25 NOTE — ED Triage Notes (Signed)
 Pt here POV with reports of difficulty answering questions. Pt reports difficulty getting her words out. Daughter was asking pt questions like her birthdate and the grandchildrens names. Family at bedside state patient is having some trouble remembering things. Symptoms started yesterday. No unilateral weakness, no facial droop. Denies numbness/tingling. Hypertensive in triage.

## 2024-06-25 NOTE — ED Provider Notes (Signed)
 [place] EMERGENCY DEPARTMENT AT [facility] Provider Note   CSN: [id number] Arrival date & time: 06/25/24  1118     Patient presents with: Hypertension and Altered Mental Status   Susan Roman is a 68 y.o. female with no pertinent medical history who presents the emergency department for evaluation of hypertension and difficulty with word finding.  Patient states that she was driving yesterday morning and started noticing she was having difficulty recounting the names of people she knows.  That continued throughout the day and she called to inform her sister of this information.  Patient went to bed and when she woke up this morning, she was talking to her cousin and was unable to recount the names of her grandchildren, which is typically unlike her.  She then came to the emergency department with significantly elevated blood pressure and a mild left-sided tension-like headache.  She does not appear to be altered and is able to answer questions appropriately.  Her heart rate is elevated, but she does not have any chest pain, shortness of breath, nausea.  Additionally, she reports no neurologic symptoms.  She does not have any dizziness, diplopia, paresthesias, weakness.  She does not currently take any medication for blood pressure and reports never having pressure this high.  Her sister is at bedside, who corroborates the story.   Hypertension Associated symptoms include headaches.  Altered Mental Status Associated symptoms: headaches   Associated symptoms: no rash and no vomiting        Prior to Admission medications   Not on File    Allergies: Patient has no known allergies.    Review of Systems  Gastrointestinal:  Negative for vomiting.  Skin:  Negative for color change and rash.  Neurological:  Positive for headaches.  All other systems reviewed and are negative.   Updated Vital Signs BP (!) 203/97   Pulse 96   Temp 99 F (37.2 C) (Oral)   Resp 16    SpO2 98%   Physical Exam Vitals and nursing note reviewed.  Constitutional:      Appearance: Normal appearance.  HENT:     Head: Normocephalic and atraumatic.     Mouth/Throat:     Mouth: Mucous membranes are moist.  Eyes:     General: No scleral icterus.       Right eye: No discharge.        Left eye: No discharge.     Extraocular Movements: Extraocular movements intact.     Conjunctiva/sclera: Conjunctivae normal.     Pupils: Pupils are equal, round, and reactive to light.     Comments: EOMs intact.  Pupils equal, round reactive to light.  No nystagmus.  Cardiovascular:     Rate and Rhythm: Regular rhythm. Tachycardia present.     Pulses: Normal pulses.  Pulmonary:     Effort: Pulmonary effort is normal.     Breath sounds: Normal breath sounds.  Abdominal:     General: There is no distension.     Tenderness: There is no abdominal tenderness.  Musculoskeletal:        General: No deformity.     Cervical back: Normal range of motion.  Skin:    General: Skin is warm and dry.     Capillary Refill: Capillary refill takes less than 2 seconds.  Neurological:     Mental Status: She is alert.     Cranial Nerves: No cranial nerve deficit.     Sensory: No sensory deficit.  Motor: No weakness.     Comments: Patient speaks in full goal oriented sentences. Cranial nerves 3-12 grossly intact. DTRs normal and symmetric. Equal grip strength bilateral with 5/5 strength against resistance in upper and lower extremities. No sensory or motor deficits appreciated.   Psychiatric:        Mood and Affect: Mood normal.     (all labs ordered are listed, but only abnormal results are displayed) Labs Reviewed  COMPREHENSIVE METABOLIC PANEL WITH GFR - Abnormal; Notable for the following components:      Result Value   Glucose, Bld 140 (*)    Creatinine, Ser 1.06 (*)    GFR, Estimated 57 (*)    All other components within normal limits  URINALYSIS, ROUTINE W REFLEX MICROSCOPIC - Abnormal;  Notable for the following components:   Color, Urine STRAW (*)    Specific Gravity, Urine 1.002 (*)    All other components within normal limits  CBG MONITORING, ED - Abnormal; Notable for the following components:   Glucose-Capillary 150 (*)    All other components within normal limits  I-STAT CHEM 8, ED - Abnormal; Notable for the following components:   Creatinine, Ser 1.10 (*)    Glucose, Bld 103 (*)    Calcium, Ion 1.11 (*)    All other components within normal limits  CBC    EKG: EKG Interpretation Date/Time:  Saturday June 25 2024 11:28:01 EDT Ventricular Rate:  123 PR Interval:  130 QRS Duration:  122 QT Interval:  336 QTC Calculation: 481 R Axis:   -38  Text Interpretation: Sinus tachycardia Possible Left atrial enlargement Left axis deviation Right bundle branch block Abnormal ECG No previous ECGs available Confirmed by Francesca Fallow ([id number]) on 06/25/2024 11:45:48 AM  Radiology: CT ANGIO HEAD NECK W WO CM Result Date: 06/25/2024 EXAM: CT HEAD WITHOUT CTA HEAD AND NECK WITH AND WITHOUT 06/25/2024 02:17:35 PM TECHNIQUE: CTA of the head and neck was performed with and without the administration of intravenous contrast. Noncontrast CT of the head with reconstructed 2-D images are also provided for review. Multiplanar 2D and/or 3D reformatted images are provided for review. Automated exposure control, iterative reconstruction, and/or weight based adjustment of the mA/kV was utilized to reduce the radiation dose to as low as reasonably achievable. COMPARISON: None available CLINICAL HISTORY: Possible stroke. Chief complaints include hypertension, altered mental status, and dysarthria. 75 mL Omni350 was administered. FINDINGS: CT HEAD: BRAIN AND VENTRICLES: A focal hemorrhage in the posterior left temporal and parietal lobe measures 2.2 x 2.6 x 1.8 cm. Mild surrounding vasogenic edema is present. The estimated volume for this hemorrhage is 5.1 cm. No focal vascular lesion or  spot sign is associated with the hemorrhage. Atrophy and white matter changes are mildly advanced for age. No mass effect or midline shift. No extra-axial fluid collection. Gray-white differentiation is maintained. No hydrocephalus. ORBITS: No acute abnormality. SINUSES: Minimal mucosal thickening is present in the maxillary sinuses bilaterally. SOFT TISSUES AND SKULL: No acute abnormality. CTA NECK: AORTIC ARCH AND ARCH VESSELS: Minimal calcification is present in the left subclavian artery proximal to the vertebral artery origin. No dissection or arterial injury. No significant stenosis of the brachiocephalic or subclavian arteries. CERVICAL CAROTID ARTERIES: Moderate tortuosity is present in the right internal carotid artery just below the skull base. No dissection, arterial injury, or hemodynamically significant stenosis by NASCET criteria. CERVICAL VERTEBRAL ARTERIES: Minimal calcifications are present at the joint margin of the left vertebral artery. No dissection, arterial injury, or significant stenosis.  VISUALIZED LUNGS AND MEDIASTINUM: Unremarkable. SOFT TISSUES: No acute abnormality. BONES: Straightening of the normal cervical lordosis is present. Chronic disc disease and intervertebral spurring is present at C5-C6 and C6-C7. No focal osseous lesions are present. CTA HEAD: ANTERIOR CIRCULATION: Atherosclerotic calcifications are present within the cavernous internal carotid arteries bilaterally without significant stenoses relative to the ICA terminate. No significant stenosis of the internal carotid arteries. No significant stenosis of the anterior cerebral arteries. No significant stenosis of the middle cerebral arteries. No aneurysm. POSTERIOR CIRCULATION: No significant stenosis of the posterior cerebral arteries. No significant stenosis of the basilar artery. No significant stenosis of the vertebral arteries. No aneurysm. OTHER: No dural venous sinus thrombosis on this non-dedicated study.  IMPRESSION: 1. Focal hemorrhage in the posterior left temporal and parietal lobe measuring 2.2 x 2.6 x 1.8 cm with estimated volume of approximately 5.1 mL and mild surrounding vasogenic edema. No associated spot sign or focal vascular lesion. 2. Mildly advanced atrophy and white matter changes for age. 3. Atherosclerotic calcifications within the cavernous internal carotid arteries bilaterally without significant stenoses relative to the ICA terminus. Moderate tortuosity in the right internal carotid artery just below the skull base. 4. Critical findings were communicated by phone to the [facility] at 02:42 pm. Electronically signed by: Lonni Necessary MD 06/25/2024 02:45 PM EDT RP Workstation: [id number]     .Critical Care  Performed by: [facility] RAMAN, PA-C Authorized by: [facility] Mar[facility] RAMAN, PA-C   Critical care provider statement:    Critical care time (minutes):  46   Critical care was necessary to treat or prevent imminent or life-threatening deterioration of the following conditions:  CNS failure or compromise   Critical care was time spent personally by me on the following activities:  Blood draw for specimens, development of treatment plan with patient or surrogate, discussions with consultants, discussions with primary provider, evaluation of patient's response to treatment, examination of patient, obtaining history from patient or surrogate, interpretation of cardiac output measurements, pulse oximetry, ordering and review of radiographic studies, ordering and review of laboratory studies, ordering and performing treatments and interventions and re-evaluation of patient's condition   I assumed direction of critical care for this patient from another provider in my specialty: no     Care discussed with: admitting provider      Medications Ordered in the ED   stroke: early stages of recovery book (has no administration in time range)  acetaminophen (TYLENOL) tablet 650 mg  (has no administration in time range)    Or  acetaminophen (TYLENOL) 160 MG/5ML solution 650 mg (has no administration in time range)    Or  acetaminophen (TYLENOL) suppository 650 mg (has no administration in time range)  senna-docusate (Senokot-S) tablet 1 tablet (has no administration in time range)  pantoprazole (PROTONIX) injection 40 mg (has no administration in time range)  labetalol (NORMODYNE) injection 20 mg (20 mg Intravenous Given 06/25/24 1430)    And  clevidipine (CLEVIPREX) infusion 0.5 mg/mL (2 mg/hr Intravenous New Bag/Given 06/25/24 1442)  iohexol (OMNIPAQUE) 350 MG/ML injection 75 mL (75 mLs Intravenous Contrast Given 06/25/24 1418)                               Medical Decision Making Amount and/or Complexity of Data Reviewed Labs: ordered. Radiology: ordered.  Risk Prescription drug management.   This patient presents to the ED for concern of hypertension and difficulty word finding, this involves an extensive  number of treatment options, and is a complaint that carries with it a high risk of complications and morbidity.  Differential diagnosis includes: Hypertensive urgency, hypertensive emergency, subarachnoid hemorrhage, hemorrhagic stroke, ischemic stroke, skull fracture  Co morbidities:  none   Lab Tests:  I Ordered, and personally interpreted labs.  The pertinent results include:    - CBG on arrival: 150  Imaging Studies:  I ordered imaging studies including CT angio head and neck I independently visualized and interpreted imaging which showed left posterior brain bleed I agree with the radiologist interpretation  Radiologist called me personally at 2: 48 PM to inform me of the CT results.  Cardiac Monitoring/ECG:  The patient was maintained on a cardiac monitor.  I personally viewed and interpreted the cardiac monitored which showed an underlying rhythm of: Sinus tachycardia  Medicines ordered and prescription drug management:  I ordered  medication including  Medications   stroke: early stages of recovery book (has no administration in time range)  acetaminophen (TYLENOL) tablet 650 mg (has no administration in time range)    Or  acetaminophen (TYLENOL) 160 MG/5ML solution 650 mg (has no administration in time range)    Or  acetaminophen (TYLENOL) suppository 650 mg (has no administration in time range)  senna-docusate (Senokot-S) tablet 1 tablet (has no administration in time range)  pantoprazole (PROTONIX) injection 40 mg (has no administration in time range)  labetalol (NORMODYNE) injection 20 mg (20 mg Intravenous Given 06/25/24 1430)    And  clevidipine (CLEVIPREX) infusion 0.5 mg/mL (2 mg/hr Intravenous New Bag/Given 06/25/24 1442)  iohexol (OMNIPAQUE) 350 MG/ML injection 75 mL (75 mLs Intravenous Contrast Given 06/25/24 1418)   for blood pressure control and hemorrhagic stroke management Reevaluation of the patient after these medicines showed that the patient stayed the same I have reviewed the patients home medicines and have made adjustments as needed  Test Considered:   none  Critical Interventions:   emergent neurology consult - Aggressive blood pressure control - Hemorrhagic stroke management  Consultations Obtained: -Neurology  Problem List / ED Course:     ICD-10-CM   1. Hemorrhagic stroke (HCC)  I61.9       MDM: 68 year old female who presents emergency department for evaluation of elevated blood pressure and difficulty with word finding.  Patient states when she was in the car yesterday morning she began having difficulty remembering names of people familiar to her.  This continued throughout the day and into the evening.  She informed her sister of this information but did not come to the emergency department right away.  This morning around 8:30 AM, she was talking to a family member and could not recount the names of her grandchildren.  When she informed her sister of these events again,  she brought her to the emergency department.  Initial blood pressure was 223/131 with a pulse of 130.  CBC and CMP ordered.  CBG on arrival was 150.  Upon physical exam, patient exhibits no neurologic deficits.  Her strength is intact in her upper and lower extremities and is 5 out of 5 bilaterally.  She does not have any significant headache, visual changes, dizziness, weakness.  She continues to remain tachycardic during my exam.  Given length of symptoms and high suspicion for possible stroke, I ordered a CTA head and neck.  The initial rule read of the CT reveals a right posterior brain bleed.  Neurology immediately consulted and is currently at bedside at 2:30pm.  Patient does not report taking  any blood thinners.  Neurology recommendation to start labetalol for blood pressure control as well as clevidipine and Protonix.  2:49 PM Plan for admission to neurology team.  Neurology will assume primary care of this patient.  Labs are unremarkable.   Dispostion:  After consideration of the diagnostic results and the patients response to treatment, I feel that the patient would benefit from hospital admission and blood pressure control as well as early stages of stroke recovery.   Final diagnoses:  Hemorrhagic stroke [facility])    ED Discharge Orders     None          Torrence Marry RAMAN, PA-C 06/25/24 1453    Francesca Elsie CROME, MD 06/26/24 [phone]

## 2024-06-26 ENCOUNTER — Inpatient Hospital Stay (HOSPITAL_COMMUNITY)

## 2024-06-26 ENCOUNTER — Encounter (HOSPITAL_COMMUNITY): Payer: Self-pay | Admitting: Neurology

## 2024-06-26 DIAGNOSIS — I10 Essential (primary) hypertension: Secondary | ICD-10-CM | POA: Diagnosis not present

## 2024-06-26 DIAGNOSIS — I6389 Other cerebral infarction: Secondary | ICD-10-CM | POA: Diagnosis not present

## 2024-06-26 DIAGNOSIS — I611 Nontraumatic intracerebral hemorrhage in hemisphere, cortical: Secondary | ICD-10-CM | POA: Diagnosis not present

## 2024-06-26 DIAGNOSIS — I161 Hypertensive emergency: Secondary | ICD-10-CM | POA: Diagnosis not present

## 2024-06-26 DIAGNOSIS — R29703 NIHSS score 3: Secondary | ICD-10-CM | POA: Diagnosis not present

## 2024-06-26 DIAGNOSIS — I739 Peripheral vascular disease, unspecified: Secondary | ICD-10-CM

## 2024-06-26 DIAGNOSIS — E785 Hyperlipidemia, unspecified: Secondary | ICD-10-CM

## 2024-06-26 HISTORY — PX: TRANSTHORACIC ECHOCARDIOGRAM: SHX275

## 2024-06-26 LAB — ECHOCARDIOGRAM COMPLETE
AR max vel: 3.28 cm2
AV Area VTI: 3.34 cm2
AV Area mean vel: 2.95 cm2
AV Mean grad: 3 mmHg
AV Peak grad: 5.3 mmHg
Ao pk vel: 1.15 m/s
Area-P 1/2: 5.09 cm2
Calc EF: 64.3 %
Height: 63 in
MV VTI: 4.23 cm2
S' Lateral: 2.5 cm
Single Plane A2C EF: 64.2 %
Single Plane A4C EF: 61.6 %
Weight: 2313.95 [oz_av]

## 2024-06-26 LAB — HEMOGLOBIN A1C
Hgb A1c MFr Bld: 5.2 % (ref 4.8–5.6)
Mean Plasma Glucose: 102.54 mg/dL

## 2024-06-26 LAB — BASIC METABOLIC PANEL WITH GFR
Anion gap: 13 (ref 5–15)
BUN: 12 mg/dL (ref 8–23)
CO2: 25 mmol/L (ref 22–32)
Calcium: 8.9 mg/dL (ref 8.9–10.3)
Chloride: 103 mmol/L (ref 98–111)
Creatinine, Ser: 0.83 mg/dL (ref 0.44–1.00)
GFR, Estimated: >60 mL/min (ref >60)
Glucose, Bld: 115 mg/dL — ABNORMAL HIGH (ref 70–99)
Potassium: 3.6 mmol/L (ref 3.5–5.1)
Sodium: 141 mmol/L (ref 135–145)

## 2024-06-26 LAB — RAPID URINE DRUG SCREEN, HOSP PERFORMED
Amphetamines: NOT DETECTED
Barbiturates: NOT DETECTED
Benzodiazepines: NOT DETECTED
Cocaine: NOT DETECTED
Opiates: NOT DETECTED
Tetrahydrocannabinol: NOT DETECTED

## 2024-06-26 LAB — LIPID PANEL
Cholesterol: 190 mg/dL (ref 0–200)
HDL: 77 mg/dL (ref >40)
LDL Cholesterol: 101 mg/dL — ABNORMAL HIGH (ref 0–99)
Total CHOL/HDL Ratio: 2.5 ratio
Triglycerides: 59 mg/dL (ref <150)
VLDL: 12 mg/dL (ref 0–40)

## 2024-06-26 LAB — CBC
HCT: 39.9 % (ref 36.0–46.0)
Hemoglobin: 13.3 g/dL (ref 12.0–15.0)
MCH: 31.2 pg (ref 26.0–34.0)
MCHC: 33.3 g/dL (ref 30.0–36.0)
MCV: 93.7 fL (ref 80.0–100.0)
Platelets: 237 K/uL (ref 150–400)
RBC: 4.26 MIL/uL (ref 3.87–5.11)
RDW: 12.8 % (ref 11.5–15.5)
WBC: 7.1 K/uL (ref 4.0–10.5)
nRBC: 0 % (ref 0.0–0.2)

## 2024-06-26 MED ORDER — AMLODIPINE BESYLATE 10 MG PO TABS
10.0000 mg | ORAL_TABLET | Freq: Every day | ORAL | Status: DC
  Administered 2024-06-26 – 2024-06-28 (×3): 10 mg via ORAL
  Filled 2024-06-26 (×3): qty 1

## 2024-06-26 MED ORDER — ORAL CARE MOUTH RINSE: 15.0000 mL | OROMUCOSAL | Status: DC | PRN

## 2024-06-26 MED ORDER — CHLORHEXIDINE GLUCONATE CLOTH 2 % EX PADS
6.0000 | MEDICATED_PAD | Freq: Every day | CUTANEOUS | Status: DC
  Administered 2024-06-27 – 2024-06-28 (×2): 6 via TOPICAL

## 2024-06-26 MED ORDER — LOSARTAN POTASSIUM 50 MG PO TABS
50.0000 mg | ORAL_TABLET | Freq: Every day | ORAL | Status: DC
  Administered 2024-06-26: 50 mg via ORAL
  Filled 2024-06-26 (×2): qty 1

## 2024-06-26 MED ORDER — GADOBUTROL 1 MMOL/ML IV SOLN
6.0000 mL | Freq: Once | INTRAVENOUS | Status: AC | PRN
  Administered 2024-06-26: 6 mL via INTRAVENOUS

## 2024-06-26 MED ORDER — LABETALOL HCL 5 MG/ML IV SOLN
10.0000 mg | INTRAVENOUS | Status: DC | PRN
  Administered 2024-06-26: 10 mg via INTRAVENOUS
  Filled 2024-06-26: qty 4

## 2024-06-26 MED ORDER — HYDRALAZINE HCL 20 MG/ML IJ SOLN
10.0000 mg | INTRAMUSCULAR | Status: DC | PRN
  Administered 2024-06-26: 10 mg via INTRAVENOUS
  Filled 2024-06-26: qty 1

## 2024-06-26 NOTE — Progress Notes (Addendum)
 STROKE TEAM PROGRESS NOTE    SIGNIFICANT HOSPITAL EVENTS 10/25: Patient admitted with left temporoparietal ICH  INTERIM HISTORY/SUBJECTIVE  Patient continues to require Cleviprex to maintain blood pressure within goals.  Will attempt to wean today and possibly transfer patient out of the ICU this afternoon.  OBJECTIVE  CBC    Component Value Date/Time   WBC 7.1 06/26/2024 0343   RBC 4.26 06/26/2024 0343   HGB 13.3 06/26/2024 0343   HCT 39.9 06/26/2024 0343   PLT 237 06/26/2024 0343   MCV 93.7 06/26/2024 0343   MCH 31.2 06/26/2024 0343   MCHC 33.3 06/26/2024 0343   RDW 12.8 06/26/2024 0343    BMET    Component Value Date/Time   NA 141 06/26/2024 0343   K 3.6 06/26/2024 0343   CL 103 06/26/2024 0343   CO2 25 06/26/2024 0343   GLUCOSE 115 (H) 06/26/2024 0343   BUN 12 06/26/2024 0343   CREATININE 0.83 06/26/2024 0343   CALCIUM 8.9 06/26/2024 0343   GFRNONAA >60 06/26/2024 0343    IMAGING past 24 hours ECHOCARDIOGRAM COMPLETE Result Date: 06/26/2024    ECHOCARDIOGRAM REPORT   Patient Name:   Susan Roman Date of Exam: 06/26/2024 Medical Rec #:  [id number]          Height:       63.0 in Accession #:    [id number]         Weight:       144.6 lb Date of Birth:  29-Jun-1956          BSA:          1.685 m Patient Age:    68 years           BP:           130/74 mmHg Patient Gender: F                  HR:           93 bpm. Exam Location:  Inpatient Procedure: 2D Echo, Color Doppler and Cardiac Doppler (Both Spectral and Color            Flow Doppler were utilized during procedure). Indications:    Stroke I63.9  History:        Patient has no prior history of Echocardiogram examinations.                 Risk Factors:Hypertension. H/O Hemorrhagic stroke.  Sonographer:    BERNARDA ROCKS Referring Phys: [id number] [facility] KHALIQDINA IMPRESSIONS  1. Left ventricular ejection fraction, by estimation, is 60 to 65%. The left ventricle has normal function. The left ventricle has no regional wall  motion abnormalities. Left ventricular diastolic parameters were normal.  2. Right ventricular systolic function is normal. The right ventricular size is normal. Tricuspid regurgitation signal is inadequate for assessing PA pressure.  3. A small pericardial effusion is present. The pericardial effusion is anterior to the right ventricle.  4. The mitral valve is normal in structure. No evidence of mitral valve regurgitation. No evidence of mitral stenosis.  5. The aortic valve is normal in structure. Aortic valve regurgitation is not visualized. No aortic stenosis is present.  6. The inferior vena cava is normal in size with greater than 50% respiratory variability, suggesting right atrial pressure of 3 mmHg. FINDINGS  Left Ventricle: Left ventricular ejection fraction, by estimation, is 60 to 65%. The left ventricle has normal function. The left ventricle has no regional wall motion abnormalities. The left  ventricular internal cavity size was normal in size. There is  no left ventricular hypertrophy. Left ventricular diastolic parameters were normal. Right Ventricle: The right ventricular size is normal. No increase in right ventricular wall thickness. Right ventricular systolic function is normal. Tricuspid regurgitation signal is inadequate for assessing PA pressure. Left Atrium: Left atrial size was normal in size. Right Atrium: Right atrial size was normal in size. Pericardium: A small pericardial effusion is present. The pericardial effusion is anterior to the right ventricle. Mitral Valve: The mitral valve is normal in structure. No evidence of mitral valve regurgitation. No evidence of mitral valve stenosis. MV peak gradient, 3.5 mmHg. The mean mitral valve gradient is 2.0 mmHg. Tricuspid Valve: The tricuspid valve is normal in structure. Tricuspid valve regurgitation is trivial. No evidence of tricuspid stenosis. Aortic Valve: The aortic valve is normal in structure. Aortic valve regurgitation is not  visualized. No aortic stenosis is present. Aortic valve mean gradient measures 3.0 mmHg. Aortic valve peak gradient measures 5.3 mmHg. Aortic valve area, by VTI measures 3.34 cm. Pulmonic Valve: The pulmonic valve was not well visualized. Pulmonic valve regurgitation is not visualized. No evidence of pulmonic stenosis. Aorta: The aortic root is normal in size and structure. Venous: The inferior vena cava is normal in size with greater than 50% respiratory variability, suggesting right atrial pressure of 3 mmHg. IAS/Shunts: No atrial level shunt detected by color flow Doppler.  LEFT VENTRICLE PLAX 2D LVIDd:         4.10 cm      Diastology LVIDs:         2.50 cm      LV e' medial:    10.10 cm/s LV PW:         0.70 cm      LV E/e' medial:  6.6 LV IVS:        0.80 cm      LV e' lateral:   13.80 cm/s LVOT diam:     2.10 cm      LV E/e' lateral: 4.8 LV SV:         74 LV SV Index:   44 LVOT Area:     3.46 cm  LV Volumes (MOD) LV vol d, MOD A2C: 92.5 ml LV vol d, MOD A4C: 114.0 ml LV vol s, MOD A2C: 33.1 ml LV vol s, MOD A4C: 43.8 ml LV SV MOD A2C:     59.4 ml LV SV MOD A4C:     114.0 ml LV SV MOD BP:      68.7 ml RIGHT VENTRICLE             IVC RV Basal diam:  3.50 cm     IVC diam: 1.30 cm RV S prime:     27.80 cm/s TAPSE (M-mode): 1.8 cm LEFT ATRIUM             Index        RIGHT ATRIUM           Index LA diam:        2.00 cm 1.19 cm/m   RA Area:     11.10 cm LA Vol (A2C):   24.6 ml 14.60 ml/m  RA Volume:   26.90 ml  15.97 ml/m LA Vol (A4C):   38.5 ml 22.85 ml/m LA Biplane Vol: 30.5 ml 18.10 ml/m  AORTIC VALVE                    PULMONIC VALVE AV Area (  Vmax):    3.28 cm     PV Vmax:       0.90 m/s AV Area (Vmean):   2.95 cm     PV Peak grad:  3.2 mmHg AV Area (VTI):     3.34 cm AV Vmax:           115.00 cm/s AV Vmean:          82.200 cm/s AV VTI:            0.222 m AV Peak Grad:      5.3 mmHg AV Mean Grad:      3.0 mmHg LVOT Vmax:         109.00 cm/s LVOT Vmean:        70.000 cm/s LVOT VTI:          0.214 m  LVOT/AV VTI ratio: 0.96  AORTA Ao Root diam: 3.40 cm Ao Asc diam:  3.60 cm MITRAL VALVE MV Area (PHT): 5.09 cm    SHUNTS MV Area VTI:   4.23 cm    Systemic VTI:  0.21 m MV Peak grad:  3.5 mmHg    Systemic Diam: 2.10 cm MV Mean grad:  2.0 mmHg MV Vmax:       0.94 m/s MV Vmean:      61.7 cm/s MV Decel Time: 149 msec MV E velocity: 66.60 cm/s MV A velocity: 92.20 cm/s MV E/A ratio:  0.72 Kardie Tobb DO Electronically signed by Dub Huntsman DO Signature Date/Time: 06/26/2024/12:32:04 PM    Final    MR BRAIN W WO CONTRAST Result Date: 06/26/2024 EXAM: MRI BRAIN WITH AND WITHOUT CONTRAST 06/26/2024 01:42:49 AM TECHNIQUE: Multiplanar multisequence MRI of the head/brain was performed with and without the administration of intravenous contrast. COMPARISON: Same day CT head CLINICAL HISTORY: Neuro deficit, acute, stroke suspected. FINDINGS: BRAIN AND VENTRICLES: When comparing across modalities, unchanged size of a hemorrhage in the left temporal lobe. Small acute infarct medial to the hemorrhage. No substantial mass effect or midline shift. No hydrocephalus. Normal flow voids. No mass or abnormal enhancement. Advanced white matter T2 hyperintensities compatible with chronic microvascular ischemic change. 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 Dilated perivascular space in the right basal ganglia. Numerous punctate foci of susceptibility artifact in the basal ganglia, thalami, cerebellum and cortex compatible with chronic microhemorrhages. ORBITS: No acute abnormality. SINUSES: No acute abnormality. BONES AND SOFT TISSUES: Normal bone marrow signal and enhancement. No acute soft tissue abnormality. IMPRESSION: 1. When comparing across modalities, unchanged size of a hemorrhage in the left temporal lobe. 2. Small acute infarct medial to the hemorrhage. 3. Advanced chronic microvascular ischemic change. 4. 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 5. Numerous chronic microhemorrhages, most  likely due to longstanding hypertension. Electronically signed by: Gilmore Molt MD 06/26/2024 02:27 AM EDT RP Workstation: [id number]   CT HEAD POST STROKE FOLLOWUP/TIMED/STAT READ Result Date: 06/25/2024 EXAM: CT HEAD WITHOUT 06/25/2024 10:52:00 PM TECHNIQUE: CT of the head was performed without the administration of intravenous contrast. Automated exposure control, iterative reconstruction, and/or weight based adjustment of the mA/kV was utilized to reduce the radiation dose to as low as reasonably achievable. COMPARISON: Same day CT head CLINICAL HISTORY: Neuro deficit, acute, stroke suspected. Chief complaints; Hypertension; Altered Mental Status; CT HEAD POST STROKE FOLLOWUP/TIMED/STAT READ; Neuro deficit, Acute, Stroke suspected. FINDINGS: BRAIN AND VENTRICLES: No change in size of an acute intraparenchymal hemorrhage in the left temporal lobe. Similar mild surrounding edema. No substantial mass effect. No evidence of  acute infarct. No hydrocephalus. Similar patchy white matter changes, compatible with chronic microvascular ischemic disease. ORBITS: No acute abnormality. SINUSES AND MASTOIDS: No acute abnormality. SOFT TISSUES AND SKULL: No acute skull fracture. IMPRESSION: 1. No change in acute intraparenchymal hemorrhage in the left temporal lobe. Electronically signed by: Gilmore Molt MD 06/25/2024 11:12 PM EDT RP Workstation: [id number]   CT HEAD WO CONTRAST ( ) Result Date: 06/25/2024 EXAM: CT HEAD WITHOUT CONTRAST 06/25/2024 03:49:38 PM TECHNIQUE: CT of the head was performed without the administration of intravenous contrast. Automated exposure control, iterative reconstruction, and/or weight based adjustment of the mA/kV was utilized to reduce the radiation dose to as low as reasonably achievable. COMPARISON: None available. CLINICAL HISTORY: Headache, neuro deficit. Change in MS. FINDINGS: BRAIN AND VENTRICLES: Hypoattenuation white matter compatible with chronic microvessel ischemic  change. Acute intraparenchymal hematoma left temporal lobe measuring up to 2.3 cm is stable. Minimal surrounding subarachnoid hemorrhage is now apparent. Mild surrounding edema is stable. No mass effect or midline shift. No hydrocephalus. No extra-axial collection. No evidence of acute infarct. ORBITS: No acute abnormality. SINUSES: No acute abnormality. SOFT TISSUES AND SKULL: No acute soft tissue abnormality. No skull fracture. IMPRESSION: 1. Stable acute intraparenchymal hematoma in the left temporal lobe measuring up to 2.3 cm with mild surrounding edema. 2. Minimal surrounding subarachnoid hemorrhage. Electronically signed by: Lonni Necessary MD 06/25/2024 04:03 PM EDT RP Workstation: [id number]   CT ANGIO HEAD NECK W WO CM Result Date: 06/25/2024 EXAM: CT HEAD WITHOUT CTA HEAD AND NECK WITH AND WITHOUT 06/25/2024 02:17:35 PM TECHNIQUE: CTA of the head and neck was performed with and without the administration of intravenous contrast. Noncontrast CT of the head with reconstructed 2-D images are also provided for review. Multiplanar 2D and/or 3D reformatted images are provided for review. Automated exposure control, iterative reconstruction, and/or weight based adjustment of the mA/kV was utilized to reduce the radiation dose to as low as reasonably achievable. COMPARISON: None available CLINICAL HISTORY: Possible stroke. Chief complaints include hypertension, altered mental status, and dysarthria. 75 mL Omni350 was administered. FINDINGS: CT HEAD: BRAIN AND VENTRICLES: A focal hemorrhage in the posterior left temporal and parietal lobe measures 2.2 x 2.6 x 1.8 cm. Mild surrounding vasogenic edema is present. The estimated volume for this hemorrhage is 5.1 cm. No focal vascular lesion or spot sign is associated with the hemorrhage. Atrophy and white matter changes are mildly advanced for age. No mass effect or midline shift. No extra-axial fluid collection. Gray-white differentiation is maintained. No  hydrocephalus. ORBITS: No acute abnormality. SINUSES: Minimal mucosal thickening is present in the maxillary sinuses bilaterally. SOFT TISSUES AND SKULL: No acute abnormality. CTA NECK: AORTIC ARCH AND ARCH VESSELS: Minimal calcification is present in the left subclavian artery proximal to the vertebral artery origin. No dissection or arterial injury. No significant stenosis of the brachiocephalic or subclavian arteries. CERVICAL CAROTID ARTERIES: Moderate tortuosity is present in the right internal carotid artery just below the skull base. No dissection, arterial injury, or hemodynamically significant stenosis by NASCET criteria. CERVICAL VERTEBRAL ARTERIES: Minimal calcifications are present at the joint margin of the left vertebral artery. No dissection, arterial injury, or significant stenosis. VISUALIZED LUNGS AND MEDIASTINUM: Unremarkable. SOFT TISSUES: No acute abnormality. BONES: Straightening of the normal cervical lordosis is present. Chronic disc disease and intervertebral spurring is present at C5-C6 and C6-C7. No focal osseous lesions are present. CTA HEAD: ANTERIOR CIRCULATION: Atherosclerotic calcifications are present within the cavernous internal carotid arteries bilaterally without significant stenoses relative to the ICA terminate.  No significant stenosis of the internal carotid arteries. No significant stenosis of the anterior cerebral arteries. No significant stenosis of the middle cerebral arteries. No aneurysm. POSTERIOR CIRCULATION: No significant stenosis of the posterior cerebral arteries. No significant stenosis of the basilar artery. No significant stenosis of the vertebral arteries. No aneurysm. OTHER: No dural venous sinus thrombosis on this non-dedicated study. IMPRESSION: 1. Focal hemorrhage in the posterior left temporal and parietal lobe measuring 2.2 x 2.6 x 1.8 cm with estimated volume of approximately 5.1 mL and mild surrounding vasogenic edema. No associated spot sign or focal  vascular lesion. 2. Mildly advanced atrophy and white matter changes for age. 3. Atherosclerotic calcifications within the cavernous internal carotid arteries bilaterally without significant stenoses relative to the ICA terminus. Moderate tortuosity in the right internal carotid artery just below the skull base. 4. Critical findings were communicated by phone to the [facility] at 02:42 pm. Electronically signed by: Lonni Necessary MD 06/25/2024 02:45 PM EDT RP Workstation: [id number]    Vitals:   06/26/24 0900 06/26/24 1000 06/26/24 1100 06/26/24 1200  BP: (!) 153/99 130/74 (!) 147/74   Pulse: 98 91 96   Resp: (!) 21 15 20    Temp:    99.4 F (37.4 C)  TempSrc:    Oral  SpO2: 95% 92% 90%   Weight:      Height:         PHYSICAL EXAM General:  Alert, well-nourished, well-developed patient in no acute distress Psych:  Mood and affect appropriate for situation CV: Regular rate and rhythm on monitor Respiratory:  Regular, unlabored respirations on room air GI: Abdomen soft and nontender   NEURO:  Mental Status: Patient is alert and oriented to person place and situation, gives the right month but the wrong year Speech/Language: speech is without dysarthria but with mild to moderate expressive aphasia with some word finding difficulties and impaired naming  Cranial Nerves:  II: PERRL.  Right hemianopsia III, IV, VI: EOMI. Eyelids elevate symmetrically.  V: Sensation is intact to light touch and symmetrical to face.  VII: Face is symmetrical resting and smiling VIII: hearing intact to voice. IX, X: Palate elevates symmetrically. Phonation is normal.  KP:Dynloizm shrug 5/5. XII: tongue is midline without fasciculations. Motor: 5/5 strength to all muscle groups tested.  Tone: is normal and bulk is normal Sensation- Intact to light touch bilaterally.  Coordination: FTN intact bilaterally Gait- deferred  Most Recent NIH  1a Level of Conscious.: 0 1b LOC Questions: 0 1c  LOC Commands: 0 2 Best Gaze: 0 3 Visual: 0 4 Facial Palsy: 2 5a Motor Arm - left: 0 5b Motor Arm - Right: 0 6a Motor Leg - Left: 0 6b Motor Leg - Right: 0 7 Limb Ataxia: 0 8 Sensory: 0 9 Best Language: 1 10 Dysarthria: 0 11 Extinct. and Inatten.: 0 TOTAL: 3   ASSESSMENT/PLAN  [name] is a 68 y.o. female with history of hypertension admitted for expressive aphasia with difficulties and word finding and right hemianopsia.  She was found to have a small left temporoparietal ICH.  NIH on Admission 3  ICH: Small left temporoparietal ICH, etiology: Likely hypertensive CT head focal hemorrhage in the left temporal and parietal lobe measuring 5.1 mL with surrounding vasogenic edema CTA head & neck no LVO, hemodynamically significant stenosis or aneurysm Follow-up head CT: No change in acute IPH in left temporal lobe MRI unchanged size of left temporal lobe hemorrhage, small acute infarct medial to the hemorrhage, advanced  chronic microvascular ischemic changes, remote corpus callosum and left basal ganglia infarcts, numerous chronic microhemorrhages, likely hypertensive 2D Echo EF 60 to 65%  LDL 101 HgbA1c 5.2 UDS neg VTE prophylaxis - SCDs No antithrombotic prior to admission, now on No antithrombotic secondary to IPH Therapy recommendations:  Pending Disposition: Pending  Hypertensive emergency Home meds: None Unstable, requiring Cleviprex Start amlodipine 10 mg daily Add losartan 50 Start as needed labetalol and hydralazine BP goal < 160 Long term BP goal normotensive  Hyperlipidemia Home meds: None LDL 101, goal < 70 Consider to Initiate statin at discharge  Other Stroke Risk Factors Advanced age Patient has not seen a doctor in quite some time, will place [facility] consult to establish patient with PCP  Other Active Problems AKI Cre 1.06--1.10--0.83  Hospital day # 1  Patient seen by NP with MD, MD to edit note as needed. Cortney E Everitt Clint Kill , MSN,  AGACNP-BC [facility] See Amion for schedule and pager information 06/26/2024 1:14 PM  ATTENDING NOTE: I reviewed above note and agree with the assessment and plan. Pt was seen and examined.   Daughters on the phone and at the bedside. Pt awake, alert, eyes open, orientated to age, place, month, but can not tell me the year. Speech intermittent hesitation with paraphasic errors, repetition is intact but still has naming difficulty 2/6. Following all simple commands. No gaze palsy, able to track bilaterally but still has right hemianopia. No facial droop. Tongue midline. Bilateral UEs 5/5, no drift. Bilaterally LEs 5/5, no drift. Sensation symmetrical bilaterally, b/l FTN intact, gait not tested.   For detailed assessment and plan, please refer to above as I have made changes wherever appropriate.   Ary Cummins, MD PhD Stroke Neurology 06/26/2024 5:21 PM  This patient is critically ill due to ICH, hypertensive emergency and at significant risk of neurological worsening, death form hematoma expansion, cerebral edema, seizure. This patient's care requires constant monitoring of vital signs, hemodynamics, respiratory and cardiac monitoring, review of multiple databases, neurological assessment, discussion with family, other specialists and medical decision making of high complexity. I spent 40 minutes of neurocritical care time in the care of this patient.      To contact Stroke Continuity provider, please refer to [email or web address]. After hours, contact General Neurology

## 2024-06-26 NOTE — Plan of Care (Signed)
  Problem: Intracerebral Hemorrhage Tissue Perfusion: Goal: Complications of Intracerebral Hemorrhage will be minimized Outcome: Progressing   Problem: Coping: Goal: Will verbalize positive feelings about self Outcome: Progressing Goal: Will identify appropriate support needs Outcome: Progressing   Problem: Health Behavior/Discharge Planning: Goal: Goals will be collaboratively established with patient/family Outcome: Progressing   Problem: Self-Care: Goal: Ability to communicate needs accurately will improve Outcome: Progressing   Problem: Nutrition: Goal: Risk of aspiration will decrease Outcome: Progressing Goal: Dietary intake will improve Outcome: Progressing   Problem: Clinical Measurements: Goal: Ability to maintain clinical measurements within normal limits will improve Outcome: Progressing   Problem: Activity: Goal: Risk for activity intolerance will decrease Outcome: Progressing   Problem: Nutrition: Goal: Adequate nutrition will be maintained Outcome: Progressing   Problem: Elimination: Goal: Will not experience complications related to urinary retention Outcome: Progressing   Problem: Pain Managment: Goal: General experience of comfort will improve and/or be controlled Outcome: Progressing

## 2024-06-27 DIAGNOSIS — I739 Peripheral vascular disease, unspecified: Secondary | ICD-10-CM | POA: Diagnosis not present

## 2024-06-27 DIAGNOSIS — I10 Essential (primary) hypertension: Secondary | ICD-10-CM | POA: Diagnosis not present

## 2024-06-27 DIAGNOSIS — I161 Hypertensive emergency: Secondary | ICD-10-CM | POA: Diagnosis not present

## 2024-06-27 DIAGNOSIS — R000 Tachycardia, unspecified: Secondary | ICD-10-CM

## 2024-06-27 DIAGNOSIS — I611 Nontraumatic intracerebral hemorrhage in hemisphere, cortical: Secondary | ICD-10-CM | POA: Diagnosis not present

## 2024-06-27 LAB — CBC
HCT: 40.5 % (ref 36.0–46.0)
Hemoglobin: 13.3 g/dL (ref 12.0–15.0)
MCH: 31.7 pg (ref 26.0–34.0)
MCHC: 32.8 g/dL (ref 30.0–36.0)
MCV: 96.4 fL (ref 80.0–100.0)
Platelets: 222 K/uL (ref 150–400)
RBC: 4.2 MIL/uL (ref 3.87–5.11)
RDW: 12.9 % (ref 11.5–15.5)
WBC: 8.6 K/uL (ref 4.0–10.5)
nRBC: 0 % (ref 0.0–0.2)

## 2024-06-27 LAB — BASIC METABOLIC PANEL WITH GFR
Anion gap: 9 (ref 5–15)
BUN: 19 mg/dL (ref 8–23)
CO2: 26 mmol/L (ref 22–32)
Calcium: 8.9 mg/dL (ref 8.9–10.3)
Chloride: 104 mmol/L (ref 98–111)
Creatinine, Ser: 0.95 mg/dL (ref 0.44–1.00)
GFR, Estimated: >60 mL/min (ref >60)
Glucose, Bld: 113 mg/dL — ABNORMAL HIGH (ref 70–99)
Potassium: 4.4 mmol/L (ref 3.5–5.1)
Sodium: 139 mmol/L (ref 135–145)

## 2024-06-27 MED ORDER — ATORVASTATIN CALCIUM 10 MG PO TABS
20.0000 mg | ORAL_TABLET | Freq: Every day | ORAL | Status: DC
  Administered 2024-06-27 – 2024-06-28 (×2): 20 mg via ORAL
  Filled 2024-06-27 (×4): qty 2

## 2024-06-27 MED ORDER — METOPROLOL TARTRATE 25 MG PO TABS
25.0000 mg | ORAL_TABLET | Freq: Two times a day (BID) | ORAL | Status: DC
  Administered 2024-06-27 – 2024-06-28 (×3): 25 mg via ORAL
  Filled 2024-06-27 (×3): qty 1

## 2024-06-27 NOTE — Evaluation (Signed)
 Physical Therapy Evaluation and d/c  Patient Details Name: Susan Roman MRN: [id number] DOB: 02-Apr-1956 Today's Date: 06/27/2024  History of Present Illness  68 yo female presents to [facility] 10/25 with word-finding difficulties, headache. 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 PMH includes HTN.  Clinical Impression   Pt presents with [facility] strength, balance (DGI 21/24, does not indicate increased risk of falls), activity tolerance and gait. Pt ambulated good hallway distance without AD, initially had trouble with head turns during gait but improved with repeated bouts. Pt states she feels at baseline physically, suspect pt still has some higher-level word finding difficulties especially when dual tasking. PT to sign off, please reconsult if needed.          If plan is discharge home, recommend the following:     Can travel by private vehicle        Equipment Recommendations None recommended by PT  Recommendations for Other Services       Functional Status Assessment Patient has not had a recent decline in their functional status     Precautions / Restrictions Precautions Precautions: Fall Restrictions Weight Bearing Restrictions Per Provider Order: No      Mobility  Bed Mobility Overal bed mobility: Modified Independent             General bed mobility comments: mod I for increased time and use of bed rail, no physical assist    Transfers Overall transfer level: Needs assistance Equipment used: None Transfers: Sit to/from Stand Sit to Stand: Supervision           General transfer comment: for safety    Ambulation/Gait Ambulation/Gait assistance: Supervision Gait  Distance (Feet): 500 Feet Assistive device: None Gait Pattern/deviations: Step-through pattern, WFL(Within Functional Limits) Gait velocity: wfl     General Gait Details: min drifting with challenge (head turns), but improved with further gait distance and practice  Stairs Stairs: Yes Stairs assistance: Supervision Stair Management: One rail Left, Forwards, Alternating pattern Number of Stairs: 10 General stair comments: step-over-step, cues to slow down as needed but [facility]  Wheelchair Mobility     Tilt Bed    Modified Rankin (Stroke Patients Only) Modified Rankin (Stroke Patients Only) Pre-Morbid Rankin Score: No symptoms Modified Rankin: Slight disability     Balance Overall balance assessment: Modified Independent                               Standardized Balance Assessment Standardized Balance Assessment : Dynamic Gait Index   Dynamic Gait Index Level Surface: Normal Change in Gait Speed: Normal Gait with Horizontal Head Turns: Mild Impairment Gait with Vertical Head Turns: Mild Impairment Gait and Pivot Turn: Normal Step Over Obstacle: Normal Step Around Obstacles: Normal Steps: Mild Impairment Total Score: 21       Pertinent Vitals/Pain Pain Assessment Pain Assessment: No/denies pain    Home Living Family/patient expects to be discharged to:: Private residence Living Arrangements: Alone Available Help at Discharge: Family;Available PRN/intermittently Type of Home: House Home Access: Stairs to enter   Entrance Stairs-Number of Steps: 2   Home Layout: One level;Able to live on main level with bedroom/bathroom Home Equipment: None      Prior Function Prior Level of  Function : Independent/Modified Independent;Driving                     Extremity/Trunk Assessment   Upper Extremity Assessment Upper Extremity Assessment: Defer to OT evaluation    Lower Extremity Assessment Lower Extremity Assessment: Overall WFL for tasks  assessed    Cervical / Trunk Assessment Cervical / Trunk Assessment: Normal  Communication   Communication Communication: Other (comment) (some word finding difficulties with dual tasking) Factors Affecting Communication: Difficulty expressing self    Cognition Arousal: Alert Behavior During Therapy: WFL for tasks assessed/performed   PT - Cognitive impairments: No apparent impairments                       PT - Cognition Comments: WFL for PT eval, not formally tested Following commands: Intact       Cueing Cueing Techniques: Verbal cues, Gestural cues     General Comments General comments (skin integrity, edema, etc.): SBP 138-148 during session, no HA    Exercises     Assessment/Plan    PT Assessment Patient does not need any further PT services  PT Problem List         PT Treatment Interventions      PT Goals (Current goals can be found in the Care Plan section)  Acute Rehab PT Goals Patient Stated Goal: go home PT Goal Formulation: With patient/family Time For Goal Achievement: 06/27/24 Potential to Achieve Goals: Good    Frequency       Co-evaluation               AM-PAC PT 6 Clicks Mobility  Outcome Measure Help needed turning from your back to your side while in a flat bed without using bedrails?: None Help needed moving from lying on your back to sitting on the side of a flat bed without using bedrails?: None Help needed moving to and from a bed to a chair (including a wheelchair)?: None Help needed standing up from a chair using your arms (e.g., wheelchair or bedside chair)?: None Help needed to walk in hospital room?: A Little (supervision) Help needed climbing 3-5 steps with a railing? : A Little (supervision) 6 Click Score: 22    End of Session Equipment Utilized During Treatment: Gait belt Activity Tolerance: Patient tolerated treatment well Patient left: in chair;with call bell/phone within reach;with family/visitor  present (chair alarm in chair but pt and family state pt does not need it, family will ensure pt does not get up without assist) Nurse Communication: Mobility status PT Visit Diagnosis: Other abnormalities of gait and mobility (R26.89)    Time: 9072-9049 PT Time Calculation (min) (ACUTE ONLY): 23 min   Charges:   PT Evaluation $PT Eval Low Complexity: 1 Low PT Treatments $Gait Training: 8-22 mins PT General Charges $$ ACUTE PT VISIT: 1 Visit         Johana RAMAN, PT DPT Acute Rehabilitation Services Secure Chat Preferred  Office [phone]   Abriella Filkins E Stroup 06/27/2024, 11:22 AM

## 2024-06-27 NOTE — TOC CM/SW Note (Signed)
 Transition of Care [facility]) - Inpatient Brief Assessment   Patient Details  Name: Susan Roman MRN: [id number] Date of Birth: [birth year]  Transition of Care [facility]) CM/SW Contact:    [name] M, RN Phone Number: 06/27/2024, 4:57 PM   Clinical Narrative: 68 yo female presents to [facility] 10/25 with word-finding difficulties, headache. 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   PTA, pt lived alone, but has intermittent assistance from family.  She has no PCP; message to CMA to obtain PCP follow up appointment at [facility].     Transition of Care Asessment: Insurance and Status: Insurance coverage has been reviewed Patient has primary care physician: No Home environment has been reviewed: Lives alone Prior level of function:: Independent Prior/Current Home Services: No current home services Social Drivers of Health Review: SDOH reviewed no interventions necessary Readmission risk has been reviewed: Yes Transition of care needs: transition of care needs identified, TOC will continue to follow   Mliss MICAEL Fass, RN, BSN  Trauma/Neuro ICU Case Manager [phone]

## 2024-06-27 NOTE — Evaluation (Signed)
 Speech Language Pathology Evaluation Patient Details Name: Susan Roman MRN: [id number] DOB: 03-19-1956 Today's Date: 06/27/2024 Time: 8569-8541 SLP Time Calculation (min) (ACUTE ONLY): 28 min  Problem List:  Patient Active Problem List   Diagnosis Date Noted   ICH (intracerebral hemorrhage) (HCC) 06/25/2024   Past Medical History: History reviewed. No pertinent past medical history. Past Surgical History:  Past Surgical History:  Procedure Laterality Date   fibroid tumor     HPI:  [name] is a 68 y.o. female with history of hypertension admitted for expressive aphasia with difficulties and word finding and right hemianopsia.  She was found to have a small left temporoparietal ICH.   Assessment / Plan / Recommendation Clinical Impression  Pt demonstrates fluent language, in tact auditory comprehension, minimal struggle with word finding with more obscure words. In a self formulation writing task pt subtituted letters and added incorrect letters to words without awareness. Question mild dysgraphia. Pt reports some language is effortful. Recommend f/u with OP SLP after d/c. No acute f/u needed.    SLP Assessment  SLP Recommendation/Assessment: All further Speech Language Pathology needs can be addressed in the next venue of care SLP Visit Diagnosis: Aphasia (R47.01)     Assistance Recommended at Discharge  None  Functional Status Assessment Patient has had a recent decline in their functional status and demonstrates the ability to make significant improvements in function in a reasonable and predictable amount of time.  Frequency and Duration           SLP Evaluation Cognition  Overall Cognitive Status: Within Functional Limits for tasks assessed       Comprehension  Auditory Comprehension Overall Auditory Comprehension: Appears within functional limits for tasks assessed    Expression Verbal Expression Overall Verbal Expression: Appears within  functional limits for tasks assessed Written Expression Dominant Hand: Right Written Expression: Exceptions to [facility] Self Formulation Ability: Word;Sentence (minimal errors)   Oral / Motor  Oral Motor/Sensory Function Overall Oral Motor/Sensory Function: Within functional limits Motor Speech Overall Motor Speech: Appears within functional limits for tasks assessed            Sylus Stgermain, Consuelo Fitch 06/27/2024, 3:09 PM

## 2024-06-27 NOTE — Progress Notes (Signed)
 STROKE TEAM PROGRESS NOTE    SIGNIFICANT HOSPITAL EVENTS 10/25: Patient admitted with left temporoparietal ICH  INTERIM HISTORY/SUBJECTIVE Daughter is at the bedside. Pt sitting in chair, doing well. Earlier today she walked with PT in hallway without difficulty. And now PT signed off.   OBJECTIVE  CBC    Component Value Date/Time   WBC 8.6 06/27/2024 0536   RBC 4.20 06/27/2024 0536   HGB 13.3 06/27/2024 0536   HCT 40.5 06/27/2024 0536   PLT 222 06/27/2024 0536   MCV 96.4 06/27/2024 0536   MCH 31.7 06/27/2024 0536   MCHC 32.8 06/27/2024 0536   RDW 12.9 06/27/2024 0536    BMET    Component Value Date/Time   NA 139 06/27/2024 0536   K 4.4 06/27/2024 0536   CL 104 06/27/2024 0536   CO2 26 06/27/2024 0536   GLUCOSE 113 (H) 06/27/2024 0536   BUN 19 06/27/2024 0536   CREATININE 0.95 06/27/2024 0536   CALCIUM 8.9 06/27/2024 0536   GFRNONAA >60 06/27/2024 0536    IMAGING past 24 hours No results found.   Vitals:   06/27/24 0900 06/27/24 1000 06/27/24 1100 06/27/24 1200  BP: (!) 147/89 132/75 (!) 146/102 138/75  Pulse: 93 (!) 111 (!) 103 91  Resp: 14 (!) 21 (!) 21 19  Temp:      TempSrc:      SpO2: 94% (!) 88% 95% 92%  Weight:      Height:         PHYSICAL EXAM General:  Alert, well-nourished, well-developed patient in no acute distress Psych:  Mood and affect appropriate for situation CV: Regular rate and rhythm on monitor Respiratory:  Regular, unlabored respirations on room air GI: Abdomen soft and nontender   NEURO:  Mental Status: Patient is alert and oriented to person place and situation, gives the right month but the wrong year Speech/Language: speech is without dysarthria or dysarthria today  Cranial Nerves:  II: PERRL.  Right hemianopsia III, IV, VI: EOMI. Eyelids elevate symmetrically.  V: Sensation is intact to light touch and symmetrical to face.  VII: Face is symmetrical resting and smiling VIII: hearing intact to voice. IX, X: Palate  elevates symmetrically. Phonation is normal.  KP:Dynloizm shrug 5/5. XII: tongue is midline without fasciculations. Motor: 5/5 strength to all muscle groups tested.  Tone: is normal and bulk is normal Sensation- Intact to light touch bilaterally.  Coordination: FTN intact bilaterally Gait- normal with PT today   ASSESSMENT/PLAN  [name] is a 68 y.o. female with history of hypertension admitted for expressive aphasia with difficulties and word finding and right hemianopsia.  She was found to have a small left temporoparietal ICH.  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, numerous chronic microhemorrhages, likely hypertensive 2D Echo EF 60 to 65%  LDL 101 HgbA1c 5.2 UDS neg VTE prophylaxis - SCDs No antithrombotic prior to admission, now on No antithrombotic secondary to IPH Therapy recommendations: none Disposition: Pending  Hypertensive emergency Tachycardia  Home meds: None Stable, off Cleviprex On amlodipine 10 mg daily  Add metoprolol 25mg  bid Start as needed labetalol and hydralazine BP goal < 160 HR goal < 100 Long term BP goal normotensive  Hyperlipidemia Home meds: None LDL 101, goal < 70 Add lipitor 20 Continue statin at discharge  Other Stroke Risk Factors Advanced age Patient has not seen a doctor in quite some time, will place [facility] consult to establish patient with PCP  Other Active Problems AKI Cre 1.06--1.10--0.83--0.95  Hospital day #  2    Ary Cummins, MD PhD Stroke Neurology 06/27/2024 12:20 PM  This patient is critically ill due to ICH, hypertensive emergency and at significant risk of neurological worsening, death form hematoma expansion, cerebral edema, seizure. This patient's care requires constant monitoring of vital signs, hemodynamics, respiratory and cardiac monitoring, review of multiple databases, neurological assessment, discussion with family, other specialists and medical decision making of high complexity. I spent 30 minutes of neurocritical care time in the care of this patient.      To contact Stroke Continuity provider, please refer to [email or web address]. After hours, contact General Neurology

## 2024-06-28 ENCOUNTER — Other Ambulatory Visit (HOSPITAL_COMMUNITY): Payer: Self-pay

## 2024-06-28 ENCOUNTER — Telehealth: Payer: Self-pay

## 2024-06-28 DIAGNOSIS — I161 Hypertensive emergency: Secondary | ICD-10-CM | POA: Insufficient documentation

## 2024-06-28 DIAGNOSIS — I611 Nontraumatic intracerebral hemorrhage in hemisphere, cortical: Secondary | ICD-10-CM | POA: Diagnosis not present

## 2024-06-28 DIAGNOSIS — R000 Tachycardia, unspecified: Secondary | ICD-10-CM | POA: Insufficient documentation

## 2024-06-28 DIAGNOSIS — I10 Essential (primary) hypertension: Secondary | ICD-10-CM | POA: Diagnosis present

## 2024-06-28 DIAGNOSIS — E785 Hyperlipidemia, unspecified: Secondary | ICD-10-CM | POA: Insufficient documentation

## 2024-06-28 DIAGNOSIS — N179 Acute kidney failure, unspecified: Secondary | ICD-10-CM | POA: Insufficient documentation

## 2024-06-28 LAB — CBC
HCT: 39.4 % (ref 36.0–46.0)
Hemoglobin: 13.1 g/dL (ref 12.0–15.0)
MCH: 31.1 pg (ref 26.0–34.0)
MCHC: 33.2 g/dL (ref 30.0–36.0)
MCV: 93.6 fL (ref 80.0–100.0)
Platelets: 222 K/uL (ref 150–400)
RBC: 4.21 MIL/uL (ref 3.87–5.11)
RDW: 12.7 % (ref 11.5–15.5)
WBC: 8.3 K/uL (ref 4.0–10.5)
nRBC: 0 % (ref 0.0–0.2)

## 2024-06-28 LAB — BASIC METABOLIC PANEL WITH GFR
Anion gap: 7 (ref 5–15)
BUN: 21 mg/dL (ref 8–23)
CO2: 26 mmol/L (ref 22–32)
Calcium: 8.7 mg/dL — ABNORMAL LOW (ref 8.9–10.3)
Chloride: 106 mmol/L (ref 98–111)
Creatinine, Ser: 0.79 mg/dL (ref 0.44–1.00)
GFR, Estimated: >60 mL/min (ref >60)
Glucose, Bld: 113 mg/dL — ABNORMAL HIGH (ref 70–99)
Potassium: 4.1 mmol/L (ref 3.5–5.1)
Sodium: 139 mmol/L (ref 135–145)

## 2024-06-28 MED ORDER — AMLODIPINE BESYLATE 10 MG PO TABS
10.0000 mg | ORAL_TABLET | Freq: Every day | ORAL | 1 refills | Status: DC
  Filled 2024-06-28: qty 30, 30d supply, fill #0

## 2024-06-28 MED ORDER — METOPROLOL TARTRATE 25 MG PO TABS
25.0000 mg | ORAL_TABLET | Freq: Two times a day (BID) | ORAL | 1 refills | Status: DC
  Filled 2024-06-28: qty 60, 30d supply, fill #0

## 2024-06-28 MED ORDER — ATORVASTATIN CALCIUM 20 MG PO TABS
20.0000 mg | ORAL_TABLET | Freq: Every day | ORAL | 1 refills | Status: DC
  Filled 2024-06-28: qty 30, 30d supply, fill #0

## 2024-06-28 MED ORDER — METOPROLOL TARTRATE 25 MG PO TABS
25.0000 mg | ORAL_TABLET | Freq: Two times a day (BID) | ORAL | 1 refills | Status: DC
  Filled 2024-06-28: qty 30, 15d supply, fill #0

## 2024-06-28 NOTE — Telephone Encounter (Signed)
 Lvm for Susan Roman. Currently no sooner appt available.  But we have her on the waitlist, if something opens up.  For anything pressing in the meantime we do recommend an urgent care.

## 2024-06-28 NOTE — TOC Transition Note (Signed)
 Transition of Care [facility]) - Discharge Note   Patient Details  Name: Susan Roman MRN: [id number] Date of Birth: May 14, 1956  Transition of [facility]) CM/SW Contact:  Flower Franko M, RN Phone Number: 06/28/2024, 12:02 PM   Clinical Narrative:    68 yo female presents to [facility] 10/25 with word-finding difficulties, headache. 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  PTA, pt independent and lives alone; her daughter is able to provide needed assistance at home.  PT recommending no OP follow up; occupational and speech therapy recommending OP follow up.  Referrals made to [facility] for continued OT and speech therapies.  Daughter inquiring about FMLA paperwork completion; she does not have the paperwork yet.  I advised her to call [facility] office at [phone] for assistance.     Final next level of care: OP Rehab Barriers to Discharge: Barriers Resolved        Discharge Plan and Services Additional resources added to the After Visit Summary for     Discharge Planning Services: CM Consult                                 Social Drivers of Health (SDOH) Interventions SDOH Screenings   Food Insecurity: No Food Insecurity (06/25/2024)  Housing: Low Risk  (06/25/2024)  Transportation Needs: No Transportation Needs (06/25/2024)  Utilities: Not At Risk (06/25/2024)  Social Connections: Moderately Isolated (06/26/2024)  Tobacco Use: Unknown (06/26/2024)     Readmission Risk Interventions     No data to display         Mliss MICAEL Fass, RN, BSN  Trauma/Neuro ICU Case Manager [phone]

## 2024-06-28 NOTE — Discharge Summary (Addendum)
 Stroke Discharge Summary  Patient ID: Susan Roman   MRN: [id number]      DOB: 1956-07-16  Date of Admission: 06/25/2024 Date of Discharge: 06/28/2024  Attending Physician:  No att. providers found Consultant(s):    None  Patient's PCP:  Pcp, No- TOC set up with Carrol Aurora NP   DISCHARGE PRIMARY DIAGNOSIS:    ICH: Small left temporoparietal ICH, etiology: Likely hypertensive   Secondary diagnosis Hypertensive emergency  HLD AKI, resolved SVT   Allergies as of 06/28/2024   No Known Allergies      Medication List     TAKE these medications    amLODipine 10 MG tablet Commonly known as: NORVASC Take 1 tablet (10 mg total) by mouth daily. Start taking on: June 29, 2024   atorvastatin 20 MG tablet Commonly known as: LIPITOR Take 1 tablet (20 mg total) by mouth daily. Start taking on: June 29, 2024   metoprolol tartrate 25 MG tablet Commonly known as: LOPRESSOR Take 1 tablet (25 mg total) by mouth 2 (two) times daily.        LABORATORY STUDIES CBC    Component Value Date/Time   WBC 8.3 06/28/2024 0518   RBC 4.21 06/28/2024 0518   HGB 13.1 06/28/2024 0518   HCT 39.4 06/28/2024 0518   PLT 222 06/28/2024 0518   MCV 93.6 06/28/2024 0518   MCH 31.1 06/28/2024 0518   MCHC 33.2 06/28/2024 0518   RDW 12.7 06/28/2024 0518   CMP    Component Value Date/Time   NA 139 06/28/2024 0518   K 4.1 06/28/2024 0518   CL 106 06/28/2024 0518   CO2 26 06/28/2024 0518   GLUCOSE 113 (H) 06/28/2024 0518   BUN 21 06/28/2024 0518   CREATININE 0.79 06/28/2024 0518   CALCIUM 8.7 (L) 06/28/2024 0518   PROT 6.6 06/25/2024 1138   ALBUMIN 4.0 06/25/2024 1138   AST 22 06/25/2024 1138   ALT 14 06/25/2024 1138   ALKPHOS 77 06/25/2024 1138   BILITOT 0.9 06/25/2024 1138   GFRNONAA >60 06/28/2024 0518   COAGSNo results found for: INR, PROTIME Lipid Panel    Component Value Date/Time   CHOL 190 06/26/2024 0343   TRIG 59 06/26/2024 0343   HDL 77 06/26/2024  0343   CHOLHDL 2.5 06/26/2024 0343   VLDL 12 06/26/2024 0343   LDLCALC 101 (H) 06/26/2024 0343   HgbA1C  Lab Results  Component Value Date   HGBA1C 5.2 06/26/2024   Alcohol Level No results found for: [facility]   SIGNIFICANT DIAGNOSTIC STUDIES ECHOCARDIOGRAM COMPLETE Result Date: 06/26/2024    ECHOCARDIOGRAM REPORT   Patient Name:   Susan Roman Date of Exam: 06/26/2024 Medical Rec #:  [id number]          Height:       63.0 in Accession #:    [id number]         Weight:       144.6 lb Date of Birth:  10-10-1955          BSA:          1.685 m Patient Age:    68 years           BP:           130/74 mmHg Patient Gender: F                  HR:           93 bpm. Exam Location:  Inpatient Procedure: 2D Echo, Color Doppler and Cardiac Doppler (Both Spectral and Color            Flow Doppler were utilized during procedure). Indications:    Stroke I63.9  History:        Patient has no prior history of Echocardiogram examinations.                 Risk Factors:Hypertension. H/O Hemorrhagic stroke.  Sonographer:    BERNARDA ROCKS Referring Phys: [id number] [facility] KHALIQDINA IMPRESSIONS  1. Left ventricular ejection fraction, by estimation, is 60 to 65%. The left ventricle has normal function. The left ventricle has no regional wall motion abnormalities. Left ventricular diastolic parameters were normal.  2. Right ventricular systolic function is normal. The right ventricular size is normal. Tricuspid regurgitation signal is inadequate for assessing PA pressure.  3. A small pericardial effusion is present. The pericardial effusion is anterior to the right ventricle.  4. The mitral valve is normal in structure. No evidence of mitral valve regurgitation. No evidence of mitral stenosis.  5. The aortic valve is normal in structure. Aortic valve regurgitation is not visualized. No aortic stenosis is present.  6. The inferior vena cava is normal in size with greater than 50% respiratory variability, suggesting right  atrial pressure of 3 mmHg. FINDINGS  Left Ventricle: Left ventricular ejection fraction, by estimation, is 60 to 65%. The left ventricle has normal function. The left ventricle has no regional wall motion abnormalities. The left ventricular internal cavity size was normal in size. There is  no left ventricular hypertrophy. Left ventricular diastolic parameters were normal. Right Ventricle: The right ventricular size is normal. No increase in right ventricular wall thickness. Right ventricular systolic function is normal. Tricuspid regurgitation signal is inadequate for assessing PA pressure. Left Atrium: Left atrial size was normal in size. Right Atrium: Right atrial size was normal in size. Pericardium: A small pericardial effusion is present. The pericardial effusion is anterior to the right ventricle. Mitral Valve: The mitral valve is normal in structure. No evidence of mitral valve regurgitation. No evidence of mitral valve stenosis. MV peak gradient, 3.5 mmHg. The mean mitral valve gradient is 2.0 mmHg. Tricuspid Valve: The tricuspid valve is normal in structure. Tricuspid valve regurgitation is trivial. No evidence of tricuspid stenosis. Aortic Valve: The aortic valve is normal in structure. Aortic valve regurgitation is not visualized. No aortic stenosis is present. Aortic valve mean gradient measures 3.0 mmHg. Aortic valve peak gradient measures 5.3 mmHg. Aortic valve area, by VTI measures 3.34 cm. Pulmonic Valve: The pulmonic valve was not well visualized. Pulmonic valve regurgitation is not visualized. No evidence of pulmonic stenosis. Aorta: The aortic root is normal in size and structure. Venous: The inferior vena cava is normal in size with greater than 50% respiratory variability, suggesting right atrial pressure of 3 mmHg. IAS/Shunts: No atrial level shunt detected by color flow Doppler.  LEFT VENTRICLE PLAX 2D LVIDd:         4.10 cm      Diastology LVIDs:         2.50 cm      LV e' medial:    10.10  cm/s LV PW:         0.70 cm      LV E/e' medial:  6.6 LV IVS:        0.80 cm      LV e' lateral:   13.80 cm/s LVOT diam:     2.10 cm  LV E/e' lateral: 4.8 LV SV:         74 LV SV Index:   44 LVOT Area:     3.46 cm  LV Volumes (MOD) LV vol d, MOD A2C: 92.5 ml LV vol d, MOD A4C: 114.0 ml LV vol s, MOD A2C: 33.1 ml LV vol s, MOD A4C: 43.8 ml LV SV MOD A2C:     59.4 ml LV SV MOD A4C:     114.0 ml LV SV MOD BP:      68.7 ml RIGHT VENTRICLE             IVC RV Basal diam:  3.50 cm     IVC diam: 1.30 cm RV S prime:     27.80 cm/s TAPSE (M-mode): 1.8 cm LEFT ATRIUM             Index        RIGHT ATRIUM           Index LA diam:        2.00 cm 1.19 cm/m   RA Area:     11.10 cm LA Vol (A2C):   24.6 ml 14.60 ml/m  RA Volume:   26.90 ml  15.97 ml/m LA Vol (A4C):   38.5 ml 22.85 ml/m LA Biplane Vol: 30.5 ml 18.10 ml/m  AORTIC VALVE                    PULMONIC VALVE AV Area (Vmax):    3.28 cm     PV Vmax:       0.90 m/s AV Area (Vmean):   2.95 cm     PV Peak grad:  3.2 mmHg AV Area (VTI):     3.34 cm AV Vmax:           115.00 cm/s AV Vmean:          82.200 cm/s AV VTI:            0.222 m AV Peak Grad:      5.3 mmHg AV Mean Grad:      3.0 mmHg LVOT Vmax:         109.00 cm/s LVOT Vmean:        70.000 cm/s LVOT VTI:          0.214 m LVOT/AV VTI ratio: 0.96  AORTA Ao Root diam: 3.40 cm Ao Asc diam:  3.60 cm MITRAL VALVE MV Area (PHT): 5.09 cm    SHUNTS MV Area VTI:   4.23 cm    Systemic VTI:  0.21 m MV Peak grad:  3.5 mmHg    Systemic Diam: 2.10 cm MV Mean grad:  2.0 mmHg MV Vmax:       0.94 m/s MV Vmean:      61.7 cm/s MV Decel Time: 149 msec MV E velocity: 66.60 cm/s MV A velocity: 92.20 cm/s MV E/A ratio:  0.72 Kardie Tobb DO Electronically signed by Dub Huntsman DO Signature Date/Time: 06/26/2024/12:32:04 PM    Final    MR BRAIN W WO CONTRAST Result Date: 06/26/2024 EXAM: MRI BRAIN WITH AND WITHOUT CONTRAST 06/26/2024 01:42:49 AM TECHNIQUE: Multiplanar multisequence MRI of the head/brain was performed with and  without the administration of intravenous contrast. COMPARISON: Same day CT head CLINICAL HISTORY: Neuro deficit, acute, stroke suspected. FINDINGS: BRAIN AND VENTRICLES: When comparing across modalities, unchanged size of a hemorrhage in the left temporal lobe. Small acute infarct medial to the hemorrhage. No substantial mass effect or midline shift. No hydrocephalus.  Normal flow voids. No mass or abnormal enhancement. Advanced white matter T2 hyperintensities compatible with chronic microvascular ischemic change. 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 Dilated perivascular space in the right basal ganglia. Numerous punctate foci of susceptibility artifact in the basal ganglia, thalami, cerebellum and cortex compatible with chronic microhemorrhages. ORBITS: No acute abnormality. SINUSES: No acute abnormality. BONES AND SOFT TISSUES: Normal bone marrow signal and enhancement. No acute soft tissue abnormality. IMPRESSION: 1. When comparing across modalities, unchanged size of a hemorrhage in the left temporal lobe. 2. Small acute infarct medial to the hemorrhage. 3. Advanced chronic microvascular ischemic change. 4. 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 5. Numerous chronic microhemorrhages, most likely due to longstanding hypertension. Electronically signed by: Gilmore Molt MD 06/26/2024 02:27 AM EDT RP Workstation: [id number]   CT HEAD POST STROKE FOLLOWUP/TIMED/STAT READ Result Date: 06/25/2024 EXAM: CT HEAD WITHOUT 06/25/2024 10:52:00 PM TECHNIQUE: CT of the head was performed without the administration of intravenous contrast. Automated exposure control, iterative reconstruction, and/or weight based adjustment of the mA/kV was utilized to reduce the radiation dose to as low as reasonably achievable. COMPARISON: Same day CT head CLINICAL HISTORY: Neuro deficit, acute, stroke suspected. Chief complaints; Hypertension; Altered Mental Status; CT HEAD POST STROKE  FOLLOWUP/TIMED/STAT READ; Neuro deficit, Acute, Stroke suspected. FINDINGS: BRAIN AND VENTRICLES: No change in size of an acute intraparenchymal hemorrhage in the left temporal lobe. Similar mild surrounding edema. No substantial mass effect. No evidence of acute infarct. No hydrocephalus. Similar patchy white matter changes, compatible with chronic microvascular ischemic disease. ORBITS: No acute abnormality. SINUSES AND MASTOIDS: No acute abnormality. SOFT TISSUES AND SKULL: No acute skull fracture. IMPRESSION: 1. No change in acute intraparenchymal hemorrhage in the left temporal lobe. Electronically signed by: Gilmore Molt MD 06/25/2024 11:12 PM EDT RP Workstation: [id number]   CT HEAD WO CONTRAST ( ) Result Date: 06/25/2024 EXAM: CT HEAD WITHOUT CONTRAST 06/25/2024 03:49:38 PM TECHNIQUE: CT of the head was performed without the administration of intravenous contrast. Automated exposure control, iterative reconstruction, and/or weight based adjustment of the mA/kV was utilized to reduce the radiation dose to as low as reasonably achievable. COMPARISON: None available. CLINICAL HISTORY: Headache, neuro deficit. Change in MS. FINDINGS: BRAIN AND VENTRICLES: Hypoattenuation white matter compatible with chronic microvessel ischemic change. Acute intraparenchymal hematoma left temporal lobe measuring up to 2.3 cm is stable. Minimal surrounding subarachnoid hemorrhage is now apparent. Mild surrounding edema is stable. No mass effect or midline shift. No hydrocephalus. No extra-axial collection. No evidence of acute infarct. ORBITS: No acute abnormality. SINUSES: No acute abnormality. SOFT TISSUES AND SKULL: No acute soft tissue abnormality. No skull fracture. IMPRESSION: 1. Stable acute intraparenchymal hematoma in the left temporal lobe measuring up to 2.3 cm with mild surrounding edema. 2. Minimal surrounding subarachnoid hemorrhage. Electronically signed by: Lonni Necessary MD 06/25/2024 04:03 PM EDT  RP Workstation: [id number]   CT ANGIO HEAD NECK W WO CM Result Date: 06/25/2024 EXAM: CT HEAD WITHOUT CTA HEAD AND NECK WITH AND WITHOUT 06/25/2024 02:17:35 PM TECHNIQUE: CTA of the head and neck was performed with and without the administration of intravenous contrast. Noncontrast CT of the head with reconstructed 2-D images are also provided for review. Multiplanar 2D and/or 3D reformatted images are provided for review. Automated exposure control, iterative reconstruction, and/or weight based adjustment of the mA/kV was utilized to reduce the radiation dose to as low as reasonably achievable. COMPARISON: None available CLINICAL HISTORY: Possible stroke. Chief complaints include hypertension, altered mental status, and dysarthria.  75 mL Omni350 was administered. FINDINGS: CT HEAD: BRAIN AND VENTRICLES: A focal hemorrhage in the posterior left temporal and parietal lobe measures 2.2 x 2.6 x 1.8 cm. Mild surrounding vasogenic edema is present. The estimated volume for this hemorrhage is 5.1 cm. No focal vascular lesion or spot sign is associated with the hemorrhage. Atrophy and white matter changes are mildly advanced for age. No mass effect or midline shift. No extra-axial fluid collection. Gray-white differentiation is maintained. No hydrocephalus. ORBITS: No acute abnormality. SINUSES: Minimal mucosal thickening is present in the maxillary sinuses bilaterally. SOFT TISSUES AND SKULL: No acute abnormality. CTA NECK: AORTIC ARCH AND ARCH VESSELS: Minimal calcification is present in the left subclavian artery proximal to the vertebral artery origin. No dissection or arterial injury. No significant stenosis of the brachiocephalic or subclavian arteries. CERVICAL CAROTID ARTERIES: Moderate tortuosity is present in the right internal carotid artery just below the skull base. No dissection, arterial injury, or hemodynamically significant stenosis by NASCET criteria. CERVICAL VERTEBRAL ARTERIES: Minimal  calcifications are present at the joint margin of the left vertebral artery. No dissection, arterial injury, or significant stenosis. VISUALIZED LUNGS AND MEDIASTINUM: Unremarkable. SOFT TISSUES: No acute abnormality. BONES: Straightening of the normal cervical lordosis is present. Chronic disc disease and intervertebral spurring is present at C5-C6 and C6-C7. No focal osseous lesions are present. CTA HEAD: ANTERIOR CIRCULATION: Atherosclerotic calcifications are present within the cavernous internal carotid arteries bilaterally without significant stenoses relative to the ICA terminate. No significant stenosis of the internal carotid arteries. No significant stenosis of the anterior cerebral arteries. No significant stenosis of the middle cerebral arteries. No aneurysm. POSTERIOR CIRCULATION: No significant stenosis of the posterior cerebral arteries. No significant stenosis of the basilar artery. No significant stenosis of the vertebral arteries. No aneurysm. OTHER: No dural venous sinus thrombosis on this non-dedicated study. IMPRESSION: 1. Focal hemorrhage in the posterior left temporal and parietal lobe measuring 2.2 x 2.6 x 1.8 cm with estimated volume of approximately 5.1 mL and mild surrounding vasogenic edema. No associated spot sign or focal vascular lesion. 2. Mildly advanced atrophy and white matter changes for age. 3. Atherosclerotic calcifications within the cavernous internal carotid arteries bilaterally without significant stenoses relative to the ICA terminus. Moderate tortuosity in the right internal carotid artery just below the skull base. 4. Critical findings were communicated by phone to the [facility] at 02:42 pm. Electronically signed by: Lonni Necessary MD 06/25/2024 02:45 PM EDT RP Workstation: [id number]       HISTORY OF PRESENT ILLNESS 68 y.o. female with history of hypertension admitted for expressive aphasia with difficulties and word finding and right hemianopsia.  She  was found to have a small left temporoparietal ICH.  NIH on Admission 3   HOSPITAL COURSE ICH: Small left temporoparietal ICH, etiology: Likely hypertensive  CT head focal hemorrhage in the left temporal and parietal lobe measuring 5.1 mL with surrounding vasogenic edema CTA head & neck no LVO, hemodynamically significant stenosis or aneurysm Follow-up head CT: No change in acute IPH in left temporal lobe MRI unchanged size of left temporal lobe hemorrhage, small acute infarct medial to the hemorrhage, advanced chronic microvascular ischemic changes, remote corpus callosum and left basal ganglia infarcts, numerous chronic microhemorrhages, likely hypertensive 2D Echo EF 60 to 65%  HgbA1c 5.2 UDS neg No antithrombotic prior to admission, now on No antithrombotic secondary to IPH Therapy recommendations: outpatient OT/ST Disposition: home   Hypertensive emergency Home meds: None Stable, off Cleviprex On amlodipine 10 mg daily Add  metoprolol 25mg  bid BP goal < 160 HR goal < 100 Long term BP goal normotensive Cardiology outpatient referral sent    Tachycardia with SVT Had tachycardia since admission On metoprolol and tachycardia resolved One time SVT on tele with palpitation Recommend cardiology outpt follow up  Hyperlipidemia Home meds: None LDL 101, goal < 70 Add lipitor 20 Continue statin at discharge   Other Stroke Risk Factors Advanced age Patient has not seen a doctor in quite some time, will place [facility] consult to establish patient with PCP   Other Active Problems AKI Cre 1.06--1.10--0.83--0.95-0.79  DISCHARGE EXAM General:  Alert, well-nourished, well-developed patient in no acute distress Psych:  Mood and affect appropriate for situation CV: Regular rate and rhythm on monitor Respiratory:  Regular, unlabored respirations on room air GI: Abdomen soft and nontender     NEURO:  Mental Status: Patient is alert and oriented to person place and situation, gives the  right month but the wrong year Speech/Language: speech is without dysarthria or dysarthria today   Cranial Nerves:  II: PERRL.  Right hemianopsia improving, now with R visual field decreased acuity III, IV, VI: EOMI. Eyelids elevate symmetrically.  V: Sensation is intact to light touch and symmetrical to face.  VII: Face is symmetrical resting and smiling VIII: hearing intact to voice. IX, X: Palate elevates symmetrically. Phonation is normal.  KP:Dynloizm shrug 5/5. XII: tongue is midline without fasciculations. Motor: 5/5 strength to all muscle groups tested.  Tone: is normal and bulk is normal Sensation- Intact to light touch bilaterally.  Coordination: FTN intact bilaterally Gait- normal with PT today   Discharge Diet       Diet   Diet regular Room service appropriate? Yes; Fluid consistency: Thin   liquids  DISCHARGE PLAN Disposition: Home Ongoing stroke risk factor control by Primary Care Physician at time of discharge Follow-up PCP Vincente Shivers NP on Dec 9 Follow-up in [facility] in 4-6 weeks, office to schedule an appointment. Able to see NP in clinic. No driving until cleared by OT and ophthalmologist.  Follow up with cardiology as outpt  50 minutes were spent preparing discharge.   Karna Geralds DNP, ACNPC-AG  [facility]  ATTENDING NOTE: I reviewed above note and agree with the assessment and plan. Pt was seen and examined.   Daughter is at the bedside. Pt sitting in chair. R hemianopia much improved, now able to see right hand waving without problem but had some difficulty counting fingers. Recommended no driving due to visual issue. OT will see her as outpt and may drive once cleared by OT and ophthalmology. Daughter and pt are aware. Also had short SVT overnight, on metoprolol and HR normal now. Will recommend outpt follow up with neurology and cardiology.   For detailed assessment and plan, please refer to above  as I have made changes wherever appropriate.   Ary Cummins, MD PhD Stroke Neurology 06/28/2024 3:26 PM

## 2024-06-28 NOTE — Telephone Encounter (Signed)
 Copied from CRM [id number]. Topic: Appointments - Scheduling Inquiry for Clinic >> Jun 28, 2024 12:03 PM Corin V wrote: Reason for CRM: Patient daughter called regarding her new patient appointment on 12/9. Patient was in hospital for hemorrhagic stroke due to blood pressure being uncontrolled. Lum is wanting to know if anything can be done to work patient in sooner due to wanting to keep her from having to return to the emergency room. Patient has been placed on the wait list and advised to call for triage if symptoms arise, but she requested that a message still be sent to request something sooner if possible.   [place] call back: [phone]

## 2024-06-28 NOTE — Telephone Encounter (Signed)
 I dont know if patient only wanted to see [facility] but I think Dr. Bennett most likely has sooner NP appointments then [facility]. Can we see if patient can be scheduled with someone else sooner?

## 2024-06-28 NOTE — Evaluation (Signed)
 Occupational Therapy Evaluation and Discharge  Patient Details Name: Susan Roman MRN: [id number] DOB: April 11, 1956 Today's Date: 06/28/2024   History of Present Illness   68 yo female presents to [facility] 10/25 with word-finding difficulties, headache. 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 PMH includes HTN.     Clinical Impressions PTA patient independent and driving. Admitted for above and presents with problem list below, including R visual deficits (more upper quadrant), decreased recall cognitively. She is completing ADLs and functional mobility with modified independence, demonstrating ability to dual task without cueing. She has good support of daughter who plans to provide increased assist initially.  Discussed compensatory techniques for vision and cognition (using pill box), not driving until cleared by MD.  Based on performance today, pt will best benefit from continued OT services  after dc at neuro outpatient OT in order to address vision and higher level cognition.  Will defer services to outpatient and sign off acutely.      If plan is discharge home, recommend the following:   Assistance with cooking/housework;Assist for transportation     Functional Status Assessment   Patient has had a recent decline in their functional status and demonstrates the ability to make significant improvements in function in a reasonable and predictable amount of time.     Equipment Recommendations   None recommended by OT     Recommendations for Other Services         Precautions/Restrictions   Precautions Precautions: Fall Recall of Precautions/Restrictions:  Intact Restrictions Weight Bearing Restrictions Per Provider Order: No     Mobility Bed Mobility Overal bed mobility: Modified Independent             General bed mobility comments: mod I for increased time and use of bed rail, no physical assist    Transfers Overall transfer level: Modified independent                        Balance Overall balance assessment: Modified Independent                                         ADL either performed or assessed with clinical judgement   ADL Overall ADL's : Modified independent                                             Vision Baseline Vision/History: 1 Wears glasses (driving) Ability to See in Adequate Light: 0 Adequate Patient Visual Report: No change from baseline (but reports doctors telling her she has loss on R side) Vision Assessment?: Yes Eye Alignment: Within Functional Limits Ocular Range of Motion: Within Functional Limits Alignment/Gaze Preference: Within Defined Limits Tracking/Visual Pursuits: Able to track stimulus in all quads without difficulty Convergence: Within functional limits Visual Fields: Other (comment) (impaired in R  visual field, more upper quadrant than lower) Additional Comments: discussed compensatory techniques for visual deficits, provided handout on R visual field changes     Perception Perception: Within Functional Limits       Praxis  Praxis: WFL       Pertinent Vitals/Pain Pain Assessment Pain Assessment: No/denies pain     Extremity/Trunk Assessment Upper Extremity Assessment Upper Extremity Assessment: Overall WFL for tasks assessed   Lower Extremity Assessment Lower Extremity Assessment: Defer to PT evaluation   Cervical / Trunk Assessment Cervical / Trunk Assessment: Normal   Communication Communication Communication: No apparent difficulties   Cognition Arousal: Alert Behavior During Therapy: WFL for tasks  assessed/performed Cognition: Cognition impaired       Memory impairment (select all impairments): Working civil service fast streamer, Short-term memory     OT - Cognition Comments: pt some with decreased working memory to recall name/address, but overall appears functional.  she is able to dual task in hallway with increased time.                 Following commands: Intact       Cueing  General Comments   Cueing Techniques: Verbal cues;Gestural cues  VSS during session   Exercises     Shoulder Instructions      Home Living Family/patient expects to be discharged to:: Private residence Living Arrangements: Alone Available Help at Discharge: Family;Available PRN/intermittently Type of Home: House Home Access: Stairs to enter Entrance Stairs-Number of Steps: 2   Home Layout: One level;Able to live on main level with bedroom/bathroom     Bathroom Shower/Tub: Walk-in shower;Tub only   Bathroom Toilet: Standard     Home Equipment: None          Prior Functioning/Environment Prior Level of Function : Independent/Modified Independent;Driving               ADLs Comments: light IADls    OT Problem List: Decreased activity tolerance;Impaired vision/perception;Decreased cognition   OT Treatment/Interventions:        OT Goals(Current goals can be found in the care plan section)   Acute Rehab OT Goals Patient Stated Goal: home OT Goal Formulation: With patient   OT Frequency:       Co-evaluation              AM-PAC OT 6 Clicks Daily Activity     Outcome Measure Help from another person eating meals?: None Help from another person taking care of personal grooming?: None Help from another person toileting, which includes using toliet, bedpan, or urinal?: None Help from another person bathing (including washing, rinsing, drying)?: None Help from another person to put on and taking off regular upper body clothing?: None Help from another person to put on and  taking off regular lower body clothing?: None 6 Click Score: 24   End of Session Nurse Communication: Mobility status  Activity Tolerance: Patient tolerated treatment well Patient left: in chair;with call bell/phone within reach;with family/visitor present  OT Visit Diagnosis: Other symptoms and signs involving the nervous system (M70.101)                Time: 9141-9063 OT Time Calculation (min): 38 min Charges:  OT General Charges $OT Visit: 1 Visit OT Evaluation $OT Eval Moderate Complexity: 1 Mod OT Treatments $Self Care/Home Management : 8-22 mins  Etta NOVAK, OT Acute Rehabilitation Services Office [phone] Secure Chat Preferred    Etta GORMAN [place] 06/28/2024, 10:19 AM

## 2024-07-04 ENCOUNTER — Ambulatory Visit: Payer: Self-pay

## 2024-07-04 NOTE — Telephone Encounter (Signed)
 FYI Only or Action Required?: FYI only for provider: ED advised.  Patient was last seen in primary care on NA.  Called Nurse Triage reporting Hypertension.  Symptoms began today.  Interventions attempted: Prescription medications: amlodipine, metoprolol.  Symptoms are: unchanged.  Triage Disposition: Go to ED Now (Notify PCP)  Patient/caregiver understands and will follow disposition?: Yes   Copied from CRM [id number]. Topic: Clinical - Red Word Triage >> Jul 04, 2024  2:16 PM Brittany M wrote: Red Word that prompted transfer to Nurse Triage: Patient BP reading at 171/90 - Daughter [place] calling in. Reason for Disposition  Systolic BP >= 180 OR Diastolic >= 110  [8] Systolic BP >= 160 OR Diastolic >= 100 AND [2] cardiac (e.g., breathing difficulty, chest pain) or neurologic symptoms (e.g., new-onset blurred or double vision, unsteady gait)  Answer Assessment - Initial Assessment Questions Advised ED now.  Pt's daughter and patient reports:  1. BLOOD PRESSURE: What is your blood pressure? Did you take at least two measurements 5 minutes apart?     171/90, HR 78; 2:00 PM 2. ONSET: When did you take your blood pressure?     Today; nurse requested to recheck, daughter reports already 3. HOW: How did you take your blood pressure? (e.g., automatic home BP monitor, visiting nurse)     Auto BP arm;  4. HISTORY: Do you have a history of high blood pressure?     CVA,  5. MEDICINES: Are you taking any medicines for blood pressure? Have you missed any doses recently?     Metoprolol, amlodipine; both medications at 0800 today 6. OTHER SYMPTOMS: Do you have any symptoms? (e.g., blurred vision, chest pain, difficulty breathing, headache, weakness)   denies  Blurred vision, chest pain, diff breath, HA, numbness/ weakness Had hemorrhagic stroke 2 weeks ago, currently taking BP meds.  Protocols used: Blood Pressure - High-A-AH

## 2024-07-04 NOTE — Therapy (Signed)
 OUTPATIENT OCCUPATIONAL THERAPY NEURO EVALUATION  Patient Name: Susan Roman MRN: [id number] DOB:04/22/1956, 68 y.o., female Today's Date: 07/06/2024  PCP: none however Nature Conservation Officer at [facility] appointment in December REFERRING PROVIDER: Jerri Pfeiffer, MD  END OF SESSION:  OT End of Session - 07/06/24 0942     Visit Number 1    Number of Visits 4    Date for Recertification  08/05/24    Authorization Type HTA    Progress Note Due on Visit 10    OT Start Time 0845    OT Stop Time 0930    OT Time Calculation (min) 45 min    Activity Tolerance Patient tolerated treatment well    Behavior During Therapy [facility] for tasks assessed/performed          Past Medical History:  Diagnosis Date   AKI (acute kidney injury)    GAD (generalized anxiety disorder)    HLD (hyperlipidemia)    HTN (hypertension)    Intracerebral hemorrhage (HCC)    Obesity    Tachycardia    Past Surgical History:  Procedure Laterality Date   fibroid tumor     Patient Active Problem List   Diagnosis Date Noted   Essential hypertension 06/28/2024   Hypertensive emergency 06/28/2024   Tachycardia 06/28/2024   Hyperlipidemia 06/28/2024   AKI (acute kidney injury) 06/28/2024   ICH (intracerebral hemorrhage) (HCC) 06/25/2024    ONSET DATE: 06/28/2024 (referral date)   REFERRING DIAG: I61.9 (ICD-10-CM) - Hemorrhagic stroke (HCC)  THERAPY DIAG:  Visuospatial deficit  Rationale for Evaluation and Treatment: Rehabilitation  SUBJECTIVE:   SUBJECTIVE STATEMENT: My speech has come back and I don't think I need speech therapy Pt accompanied by: self and daughter  PERTINENT HISTORY: presents to [facility] 10[facility] with word-finding difficulties, headache. 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 MRI unchanged size of L temporal lobe hemorrhage, small acute infarct medial to the hemorrhage, advanced chronic microvascular ischemic changes, remote corpus callosum and  left basal ganglia infarcts, numerous chronic microhemorrhages, likely hypertensive. PMH includes HTN  PRECAUTIONS: Other: no driving  WEIGHT BEARING RESTRICTIONS: No  PAIN:  Are you having pain? No  FALLS: Has patient fallen in last 6 months? No  LIVING ENVIRONMENT: Lives with: lives alone (but currently switching b/t living with daughter and daughter staying with her) Lives in: 2 story house but stays on first floor, 2 steps to enter (landing b/t) Has following equipment at home: None  PLOF: Independent and Leisure: antiquing/shopping  PATIENT GOALS: return to everything and driving  OBJECTIVE:  Note: Objective measures were completed at Evaluation unless otherwise noted.  HAND DOMINANCE: Right  ADLs: Overall ADLs: independent Transfers/ambulation related to ADLs: Eating: independent Grooming: independent UB Dressing: independent LB Dressing: independent Toileting: independent Bathing: independent Tub Shower transfers: independent Equipment: none  IADLs: Shopping: has gone with daughter but has not yet done  Light housekeeping: pt has resumed washing dishes but not attempted other cleaning Meal Prep: has not yet attempted - was not much of a cook prior to stroke Community mobility: relies on daughter currently for transportation Medication management: independent Financial management: pt has auto -pay for everything Handwriting: 100% legible  MOBILITY STATUS: Independent    UPPER EXTREMITY ROM:  BUE AROM WNL's   UPPER EXTREMITY MMT:   BUE MMT grossly WNL's - slightly weaker Rt shoulder w/ flexion only  HAND FUNCTION: Grip strength: Right: 46 lbs; Left: 42.5 lbs  COORDINATION: 9 Hole Peg test: Right: 20.13  sec; Left: 25.33 sec  SENSATION: WFL per pt report  EDEMA: none   COGNITION: Overall cognitive status: delayed recall 3/3, spells world forwards and backwards w/o difficulty, subtracts by 7's w/ only 1 error and difficulty (premorbid difficulty  as well)  VISION: Subjective report: no visual deficits prior to stroke Baseline vision: Wears glasses for distance only Visual history: none  VISION ASSESSMENT: WFL for OROM, Smooth pursuits, and convergence Rt visual field appears WFL's w/ confrontation testing, however daughter does report Rt lower quadrant loss of vision which may have resolved - pt encouraged to see optometrist to perform perimetry testing   PERCEPTION: Not tested  PRAXIS: Not tested  OBSERVATIONS:  Pt appears to have resolved from most deficits, but has not fully resumed IADLS or driving. No further speech deficits                                                                                                                             TREATMENT DATE: 07/06/24    Discussed O.T. findings and brief need for O.T. to ensure pt is safe resuming IADLS in prep for living alone again, and if she appears WFL's for environmental scanning in prep for return to driving.   Recommended daughter allow her to clean and cook (premorbid level)  w/ supervision to ensure she is safe and no concerns  Recommended pt also see optometrist for further visual field testing, and because she has not seen an eye doctor in years.    PATIENT EDUCATION: Education details: see above Person educated: Patient and Child(ren) Education method: Explanation Education comprehension: verbalized understanding  HOME EXERCISE PROGRAM: N/A   GOALS: Goals reviewed with patient? Yes   LONG TERM GOALS: Target date: 08/05/24  Pt has safely resumed premorbid cooking level and cleaning level independently Baseline:  Goal status: INITIAL  2.  Pt to perform environmental scanning in mod distracting gym w/ simple physical task at 85% accuracy or greater Baseline:  Goal status: INITIAL   ASSESSMENT:  CLINICAL IMPRESSION: Patient is a 68 y.o. female who was seen today for occupational therapy evaluation for hemorrhagic stroke. Hx includes  HTN. Patient currently presents slightly below baseline level of functioning demonstrating functional deficits in IADLS and possiblly visual scanning d/t Rt lower quadranopisa. Pt would benefit from brief skilled OT services in the outpatient setting to work on impairments as noted to help pt return to PLOF as able.      PERFORMANCE DEFICITS: in functional skills including IADLs, endurance, decreased knowledge of precautions, and vision.   IMPAIRMENTS: are limiting patient from IADLs and driving.   CO-MORBIDITIES: may have co-morbidities  that affects occupational performance. Patient will benefit from skilled OT to address above impairments and improve overall function.  MODIFICATION OR ASSISTANCE TO COMPLETE EVALUATION: No modification of tasks or assist necessary to complete an evaluation.  OT OCCUPATIONAL PROFILE AND HISTORY: Detailed assessment: Review of records and additional review of physical, cognitive, psychosocial history related to  current functional performance.  CLINICAL DECISION MAKING: LOW - limited treatment options, no task modification necessary  REHAB POTENTIAL: Excellent  EVALUATION COMPLEXITY: Low    PLAN:  OT FREQUENCY: 1-2x/week  OT DURATION: other: up to 4 visits (anticipate only 2 visits needed)   PLANNED INTERVENTIONS: 97535 self care/ADL training, 02889 therapeutic exercise, 97530 therapeutic activity, functional mobility training, and patient/family education  RECOMMENDED OTHER SERVICES: none at this time - no longer needs speech therapy  CONSULTED AND AGREED WITH PLAN OF CARE: Patient and family member/caregiver  PLAN FOR NEXT SESSION: environmental scanning in gym with physical task, follow up on if she has resumed IADL's   Burnard JINNY Roads, OT 07/06/2024, 9:42 AM

## 2024-07-06 ENCOUNTER — Encounter: Payer: Self-pay | Admitting: Occupational Therapy

## 2024-07-06 ENCOUNTER — Ambulatory Visit: Attending: Neurology | Admitting: Occupational Therapy

## 2024-07-06 DIAGNOSIS — I619 Nontraumatic intracerebral hemorrhage, unspecified: Secondary | ICD-10-CM | POA: Diagnosis not present

## 2024-07-06 DIAGNOSIS — R41842 Visuospatial deficit: Secondary | ICD-10-CM | POA: Diagnosis present

## 2024-07-07 ENCOUNTER — Ambulatory Visit: Payer: Self-pay

## 2024-07-07 NOTE — Telephone Encounter (Signed)
 FYI Only or Action Required?: FYI only for provider:  .  Patient was last seen in primary care on New Pt.  Called Nurse Triage reporting Hypertension and New Patient (Initial Visit).   Triage Disposition: See PCP Within 2 Weeks  Patient/caregiver understands and will follow disposition?: Yes           Copied from CRM [id number]. Topic: Clinical - Red Word Triage >> Jul 07, 2024  9:38 AM Roselie BROCKS wrote: [facility] that prompted transfer to Nurse Triage: Have patients daughter on call landy) I dont show a DPR but patient has had a stroke due to high blood pressure, and blood pressure is running very high again , daughter is concerned another stoke may happen,  she does have a NP appnt in dec but nothing sooner . Reason for Disposition  [1] Systolic BP >= 130 OR Diastolic >= 80 AND [2] taking BP medications    Pt daughter calling to get sooner New Pt appt with [facility], but no access sooner than the appt she has scheduled. Triager offered to schedule at alternate [facility] office, but declined.   Of note, pt has upcoming cardiology appt and triager reassured daughter/pt that provider can manage medications, if needed.  Answer Assessment - Initial Assessment Questions 1. BLOOD PRESSURE: What is your blood pressure? Did you take at least two measurements 5 minutes apart?     144/71 Yesterday 159/89 2. ONSET: When did you take your blood pressure?     today Endorses a hemorrhagic stroke a few weeks ago -  D/C from hospital 2 Tuesdays ago. Pt did not have PCP 3. HOW: How did you take your blood pressure? (e.g., automatic home BP monitor, visiting nurse)     automatic 4. HISTORY: Do you have a history of high blood pressure?     yes 5. MEDICINES: Are you taking any medicines for blood pressure? Have you missed any doses recently?     Endorses taking rx as prescribed 6. OTHER SYMPTOMS: Do you have any symptoms? (e.g., blurred vision, chest pain, difficulty  breathing, headache, weakness)     denies 7. PREGNANCY: Is there any chance you are pregnant? When was your last menstrual period?     N/a  Protocols used: Blood Pressure - High-A-AH

## 2024-07-11 ENCOUNTER — Encounter: Payer: Self-pay | Admitting: Cardiology

## 2024-07-11 ENCOUNTER — Ambulatory Visit: Attending: Cardiology | Admitting: Cardiology

## 2024-07-11 VITALS — BP 174/72 | HR 55 | Ht 62.0 in | Wt 149.0 lb

## 2024-07-11 DIAGNOSIS — I10 Essential (primary) hypertension: Secondary | ICD-10-CM | POA: Diagnosis not present

## 2024-07-11 DIAGNOSIS — I611 Nontraumatic intracerebral hemorrhage in hemisphere, cortical: Secondary | ICD-10-CM | POA: Diagnosis not present

## 2024-07-11 DIAGNOSIS — E782 Mixed hyperlipidemia: Secondary | ICD-10-CM | POA: Diagnosis not present

## 2024-07-11 DIAGNOSIS — R000 Tachycardia, unspecified: Secondary | ICD-10-CM

## 2024-07-11 DIAGNOSIS — I161 Hypertensive emergency: Secondary | ICD-10-CM

## 2024-07-11 MED ORDER — METOPROLOL TARTRATE 25 MG PO TABS: 25.0000 mg | ORAL_TABLET | ORAL | Status: DC | PRN

## 2024-07-11 MED ORDER — CARVEDILOL 3.125 MG PO TABS: 3.1250 mg | ORAL_TABLET | Freq: Two times a day (BID) | ORAL | 3 refills | Status: AC

## 2024-07-11 MED ORDER — ATORVASTATIN CALCIUM 20 MG PO TABS: 20.0000 mg | ORAL_TABLET | Freq: Every day | ORAL | 3 refills | Status: AC

## 2024-07-11 NOTE — Progress Notes (Signed)
 " Cardiology Office Note:  .   Date:  07/14/2024  ID:  Susan Roman, DOB 09/19/1955, MRN [id number] PCP: Susan Roman  Susan Roman Cardiologist:  Susan Clay, MD     Chief Complaint  Patient presents with   Hospitalization Follow-up    Recently hospitalized for hemorrhagic stroke in the setting of hypertensive emergency   Hypertension    Patient Profile: .     Susan Roman is a \ 68 y.o. female with a PMH notable for previously borderline controlled hypertension with recent Hemorrhagic Stroke in the Setting of Hypertensive Emergency, with 1 documented short burst of SVT who presents here for cardiology evaluation of High Blood Pressure and short bursts of SVT at the request of Susan Roman LABOR, NP.  She is accompanied by her daughter here today to help out with history.  She was admitted to [facility] on June 25, 2024 for left posterior MCA ICH: Presented with word finding difficulty.  Noted to be extremely hypertensive on arrival to the ER with systolics consistently over 200.  She was not on aspirin or antiplatelet/anticoagulant.  Initial EKG showed sinus tachycardia, RBBB with left ax deviation-heart rate was 121 bpm. Head CT without contrast noted posterior left temporal and parietal lobe intraparenchymal, hemorrhage (5.1 mL with surrounding mild vasogenic edema. => CTA head and neck negative for large vessel occlusion or vascular malformation/aneurysm or spot sign. DDx was not originally hypertension related versus cerebral amyloid angiopathy or vascular malformation versus tumor-MRI brain ordered. Initially started on Cleviprex  and labetalol  during pressures down to the 130s to 150s. Hemorrhagic ICH felt to be due to Hypertensive Emergency IV medications converted to p.o.: Cleviprex  converted to amlodipine  10 mg daily, and labetalol  converted to metoprolol  to tartrate 25 mg twice daily => as it turns out metoprolol  was actually added due to a  short burst of SVT noted on telemetry prior to discharge (unfortunately, the telemetry strip with the episode is not scanned in the chart. Also started on atorvastatin  20 mg daily for vascular protection.    Susan Roman has not been seen in in the outpatient setting by anybody since discharge.  This is her first visit with anyone.  Subjective  Discussed the use of AI scribe software for clinical note transcription with the patient, who gave verbal consent to proceed.  History of Present Illness Susan Roman is a 68 year old female with a recent hemorrhagic stroke who presents for cardiology follow-up. She is accompanied by her daughter, Susan Roman.  Two weeks ago, she experienced a hemorrhagic stroke primarily affecting the left side of her brain, attributed to uncontrolled hypertension. At the time of the stroke, her blood pressure was recorded at 223/130 mmHg. She was hospitalized in the ICU where her blood pressure was managed, and she was discharged on oral medications. During her hospital stay, she had a brief episode of supraventricular tachycardia lasting 11 seconds.  Prior to the stroke, she had a history of hypertension, which was previously managed with low-dose medication. She discontinued her medication after losing weight, believing it was no longer necessary. Her blood pressure at home has been averaging in the 140s to 150s, with occasional spikes to the 170s. She is currently on metoprolol  and amlodipine  for blood pressure management.  No symptoms of shortness of breath, chest tightness, or irregular heartbeats. She has a chronic cough, which is her only significant symptom at present. Her heart rate has been noted to be lower than usual,  around 51 bpm today, whereas it was in the low hundreds during her hospital stay.  She recently underwent an echocardiogram; her recollection is that the results were normal.  Her cholesterol levels were on the higher end of normal,  with a total cholesterol of 190 mg/dL and LDL of 898 mg/dL. She has been started on a cholesterol-lowering medication. She does not have a history of diabetes, does not smoke, and does not consume alcohol.  Family history is significant for atrial fibrillation and heart attacks. She is concerned about her risk factors for stroke, given her family history and recent events.     Objective   Medications: atorvastatin  20 mg daily, amlodipine  10 mg daily, Toprol  tartrate 25 mg twice daily  Studies Reviewed: Susan Roman   EKG Interpretation Date/Time:  Monday July 11 2024 09:52:09 EST Ventricular Rate:  55 PR Interval:  138 QRS Duration:  138 QT Interval:  454 QTC Calculation: 434 R Axis:   -15  Text Interpretation: Sinus bradycardia Right bundle branch block When compared with ECG of 25-Jun-2024 11:28, Vent. rate has decreased BY  68 BPM ST no longer depressed in Anterior leads T wave inversion no longer evident in Anterior leads Confirmed by Susan Roman ([id number]) on 07/11/2024 10:38:02 AM   Lab Results  Component Value Date   CHOL 190 06/26/2024   HDL 77 06/26/2024   LDLCALC 101 (H) 06/26/2024   TRIG 59 06/26/2024   CHOLHDL 2.5 06/26/2024   Lab Results  Component Value Date   HGBA1C 5.2 06/26/2024   Results DIAGNOSTIC Echocardiogram: Normal systolic function with ejection fraction 60-65%, normal wall motion, no evidence of myocardial injury, normal ventricular wall thickness, small pericardial effusion, normal mitral and aortic valve function, normal right ventricular and atrial size, normal intracardiac pressures.  (06/26/2024) MRI Brain: Unchanged size of left temporal lobe hemorrhage.  Small acute infarct medial to the hemorrhage.  Advanced chronic microvascular ischemic changes.  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  Numerous chronic microhemorrhages most consistent to longstanding hypertension.  (06/25/2024)   Risk Assessment/Calculations:     HYPERTENSION  CONTROL Vitals:   07/11/24 0945   BP: (!) 181/76 (!) 174/72    The patient's blood pressure is elevated above target today.   In order to address the patient's elevated BP: Converting from Lopressor  to carvedilol  3.25 mg twice daily.           Physical Exam:   VS:  BP (!) 174/72   Pulse (!) 55   Ht 5' 2 (1.575 m)   Wt 149 lb (67.6 kg)   SpO2 98%   BMI 27.25 kg/m    Wt Readings from Last 3 Encounters:  07/11/24 149 lb (67.6 kg)  06/26/24 144 lb 10 oz (65.6 kg)    GEN: Well nourished, well developed in no acute distress; healthy appearing.  No obvious deficits that I can tell.  Just some mild memory issues. NECK: No JVD; No carotid bruits CARDIAC: Normal S1, S2; RRR, no murmurs, rubs, gallops RESPIRATORY:  Clear to auscultation without rales, wheezing or rhonchi ; nonlabored, good air movement. ABDOMEN: Soft, non-tender, non-distended EXTREMITIES:  No edema; No deformity      ASSESSMENT AND PLAN: .    Problem List Items Addressed This Visit       Cardiology Problems   Essential hypertension - Primary (Chronic)   Hypertension with recent hemorrhagic stroke due to severe hypertension. Blood pressure averages 140-150 mmHg with occasional spikes. Echocardiogram shows normal cardiac function. Current treatment  includes metoprolol  and amlodipine . Consideration of switching to carvedilol  for better control. Discussed potential side effects of amlodipine . Emphasized importance of controlling blood pressure and cholesterol to prevent future strokes. - Convert from metoprolol  to carvedilol  for better blood pressure control. - Continue metoprolol  as needed for SVT episodes. - Monitor blood pressure at home, especially if symptomatic. - Order Renal Artery Ultrasound to rule out Renal Artery Stenosis.  - Close follow-up, and pending renal artery Dopplers and stabilization of renal function, would likely ARB-diuretic based on JNC guidelines - Advised on lifestyle modifications,  including reducing salt intake and monitoring caffeine consumption.      Relevant Medications   metoprolol  tartrate (LOPRESSOR ) 25 MG tablet   carvedilol  (COREG ) 3.125 MG tablet   atorvastatin  (LIPITOR) 20 MG tablet   Other Relevant Orders   EKG 12-Lead (Completed)   VAS US  RENAL ARTERY DUPLEX   Hyperlipidemia (Chronic)   Cholesterol levels slightly elevated with LDL at 101 mg/dL. Statin therapy initiated to reduce stroke risk. Emphasized importance of lowering LDL to prevent future strokes. - Continue 20 mg of atorvastatin  therapy to lower LDL levels. - Would be reasonable to recheck lipid panel in January February 2026.      Relevant Medications   metoprolol  tartrate (LOPRESSOR ) 25 MG tablet   carvedilol  (COREG ) 3.125 MG tablet   atorvastatin  (LIPITOR) 20 MG tablet   Hypertensive emergency (Chronic)   She presented with accelerated hypertension/hypertensive urgency.  She was discharged home on amlodipine  10 mg and metoprolol  tartrate 25 berry twice daily. I suspect she is going need more blood pressure medications that, but I am not sure what her pressures are doing at home.  Very unusual that she would have all the sudden significant retention.  Not sure how long-lasting this is.  This is not consistent with pheochromocytoma or other types of secondary etiologies, however the suspicion of longstanding hypertension raises the question of renal artery stenosis.  - Convert from metoprolol  to carvedilol  as indicated, and will likely need to add ARB and diuretic once reestablish normal renal function and renal artery Dopplers - Check renal artery Dopplers      Relevant Medications   metoprolol  tartrate (LOPRESSOR ) 25 MG tablet   carvedilol  (COREG ) 3.125 MG tablet   atorvastatin  (LIPITOR) 20 MG tablet   Other Relevant Orders   VAS US  RENAL ARTERY DUPLEX     Other   ICH (intracerebral hemorrhage) (HCC) (Chronic)   Presumably related to external hypertension, however there was  evidence of a small infarct near the hemorrhage.  Could have been primary infarct with neurally mediated hypotension leading to accelerated hypertension.  Cannot be sure.  - Clearly not on aspirin or other antiplatelet agent because of hemorrhage, will defer to neurology as to whether or not this would be initiated. - Agree with treating lipids with a statin. - Continue to titrate BP medications as indicated.      Tachycardia (Chronic)   On her initial presentation she was in sinus tachycardia, and then had bursts of tachycardia/SVT on telemetry.  Recent episode of SVT lasting 11 seconds, likely an atrial run. No current symptoms.  Will convert from metoprolol  to carvedilol  for BP and rate control baseline. - Continue metoprolol  as needed for SVT episodes.  Discussed vagal maneuvers as first-line treatment. - Educated on vagal maneuvers for SVT management.  Avoid triggers such as excess caffeine, energy drinks or sweets. Encourage adequate hydration while watching salt intake.          Follow-Up: Return  in about 4 months (around 11/08/2024) for Routine follow up with me, [employer].  I spent 61 minutes in the care of Mrytle Bento today including reviewing labs (1 minute), reviewing studies (echocardiogram as well as CT scans and MRI reviewed.  Also hospital EKG and telemetry strips reviewed-10 minutes), face to face time discussing treatment options (32 minutes), reviewing records from hospital H&P/consultations, rounding notes and discharge summary (7 minutes), 11 minutes dictating, and documenting in the encounter.     Signed, Susan MICAEL Clay, MD, MS Susan Roman, M.D., M.S. Interventional Cardiologist  [facility] Pager # [phone]      "

## 2024-07-11 NOTE — Patient Instructions (Signed)
 Medication Instructions:   Start Carvedilol 3.125 mg twice a day   Change Metoprolol to as needed use for sustained tachycardia   *If you need a refill on your cardiac medications before your next appointment, please call your pharmacy*   Lab Work: Not needed  If you have labs (blood work) drawn today and your tests are completely normal, you will receive your results only by: MyChart Message (if you have MyChart) OR A paper copy in the mail If you have any lab test that is abnormal or we need to change your treatment, we will call you to review the results.   Testing/Procedures:  Your physician has requested that you have a renal artery duplex. During this test, an ultrasound is used to evaluate blood flow to the kidneys. Allow one hour for this exam. Do not eat after midnight the day before and avoid carbonated beverages. Take your medications as you usually do.   Follow-Up: At [facility], you and your health needs are our priority.  As part of our continuing mission to provide you with exceptional heart care, we have created designated Provider Care Teams.  These Care Teams include your primary Cardiologist (physician) and Advanced Practice Providers (APPs -  Physician Assistants and Nurse Practitioners) who all work together to provide you with the care you need, when you need it.  We recommend signing up for the patient portal called MyChart.  Sign up information is provided on this After Visit Summary.  MyChart is used to connect with patients for Virtual Visits (Telemedicine).  Patients are able to view lab/test results, encounter notes, upcoming appointments, etc.  Non-urgent messages can be sent to your provider as well.   To learn more about what you can do with MyChart, go to [email or web address].    Your next appointment:   4 month(s)  The format for your next appointment:   In Person  Provider:   Alm Clay, MD

## 2024-07-14 ENCOUNTER — Encounter: Payer: Self-pay | Admitting: Cardiology

## 2024-07-14 NOTE — Assessment & Plan Note (Signed)
 Presumably related to external hypertension, however there was evidence of a small infarct near the hemorrhage.  Could have been primary infarct with neurally mediated hypotension leading to accelerated hypertension.  Cannot be sure.  - Clearly not on aspirin or other antiplatelet agent because of hemorrhage, will defer to neurology as to whether or not this would be initiated. - Agree with treating lipids with a statin. - Continue to titrate BP medications as indicated.

## 2024-07-14 NOTE — Assessment & Plan Note (Addendum)
 On her initial presentation she was in sinus tachycardia, and then had bursts of tachycardia/SVT on telemetry.  Recent episode of SVT lasting 11 seconds, likely an atrial run. No current symptoms.  Will convert from metoprolol to carvedilol for BP and rate control baseline. - Continue metoprolol as needed for SVT episodes.  Discussed vagal maneuvers as first-line treatment. - Educated on vagal maneuvers for SVT management.  Avoid triggers such as excess caffeine, energy drinks or sweets. Encourage adequate hydration while watching salt intake.

## 2024-07-14 NOTE — Assessment & Plan Note (Signed)
 She presented with accelerated hypertension/hypertensive urgency.  She was discharged home on amlodipine 10 mg and metoprolol tartrate 25 berry twice daily. I suspect she is going need more blood pressure medications that, but I am not sure what her pressures are doing at home.  Very unusual that she would have all the sudden significant retention.  Not sure how long-lasting this is.  This is not consistent with pheochromocytoma or other types of secondary etiologies, however the suspicion of longstanding hypertension raises the question of renal artery stenosis.  - Convert from metoprolol to carvedilol as indicated, and will likely need to add ARB and diuretic once reestablish normal renal function and renal artery Dopplers - Check renal artery Dopplers

## 2024-07-14 NOTE — Assessment & Plan Note (Signed)
 Cholesterol levels slightly elevated with LDL at 101 mg/dL. Statin therapy initiated to reduce stroke risk. Emphasized importance of lowering LDL to prevent future strokes. - Continue 20 mg of atorvastatin therapy to lower LDL levels. - Would be reasonable to recheck lipid panel in January February 2026.

## 2024-07-14 NOTE — Assessment & Plan Note (Signed)
 Hypertension with recent hemorrhagic stroke due to severe hypertension. Blood pressure averages 140-150 mmHg with occasional spikes. Echocardiogram shows normal cardiac function. Current treatment includes metoprolol and amlodipine. Consideration of switching to carvedilol for better control. Discussed potential side effects of amlodipine. Emphasized importance of controlling blood pressure and cholesterol to prevent future strokes. - Convert from metoprolol to carvedilol for better blood pressure control. - Continue metoprolol as needed for SVT episodes. - Monitor blood pressure at home, especially if symptomatic. - Order Renal Artery Ultrasound to rule out Renal Artery Stenosis.  - Close follow-up, and pending renal artery Dopplers and stabilization of renal function, would likely ARB-diuretic based on JNC guidelines - Advised on lifestyle modifications, including reducing salt intake and monitoring caffeine consumption.

## 2024-07-18 ENCOUNTER — Ambulatory Visit: Admitting: General Practice

## 2024-07-18 ENCOUNTER — Ambulatory Visit

## 2024-07-18 DIAGNOSIS — R41842 Visuospatial deficit: Secondary | ICD-10-CM

## 2024-07-18 NOTE — Therapy (Signed)
 OUTPATIENT OCCUPATIONAL THERAPY NEURO TREATMENT AND DISCHARGE  Patient Name: Susan Roman MRN: [id number] DOB:12-02-1955, 68 y.o., female Today's Date: 07/18/2024  PCP: none however Nature Conservation Officer at [facility] appointment in December REFERRING PROVIDER: Jerri Pfeiffer, MD OCCUPATIONAL THERAPY DISCHARGE SUMMARY  Visits from Start of Care: Pt received 2 visits including eval from 07/06/24-07/18/24.   Current functional level related to goals / functional outcomes: Pt met 1/1 STG and 1/1 LTG   Remaining deficits: None    Education / Equipment: Visual scanning techniques  Patient agrees to discharge. Patient goals were met. Patient is being discharged due to meeting the stated rehab goals..     END OF SESSION:  OT End of Session - 07/18/24 1219     Visit Number 2    Number of Visits 4    Date for Recertification  08/05/24    Authorization Type HTA    OT Start Time 0934    OT Stop Time 1013    OT Time Calculation (min) 39 min    Activity Tolerance Patient tolerated treatment well    Behavior During Therapy WFL for tasks assessed/performed           Past Medical History:  Diagnosis Date   AKI (acute kidney injury)    GAD (generalized anxiety disorder)    HLD (hyperlipidemia)    HTN (hypertension)    Intracerebral hemorrhage (HCC) 06/25/2024   Posterior left temporal and parietal lobe intraparenchymal hemorrhage (5.1 mm)   Obesity    Stroke (HCC) 06/25/2024   MRI of the brain confirmed left temporal lobe hemorrhage with a small acute infarct medial to the hemorrhage.  Also noted:  Advanced chronic microvascular ischemic changes.  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  Numerous chronic microhemorrhages most consistent to longstanding hypertension   Tachycardia    Past Surgical History:  Procedure Laterality Date   fibroid tumor     TRANSTHORACIC ECHOCARDIOGRAM  06/26/2024   Normal LV function.  EF 60-65%, normal wall motion, no evidence  of myocardial injury, normal ventricular wall thickness, small pericardial effusion, normal mitral and aortic valve function, normal right ventricular and atrial size, normal intracardiac pressures   Patient Active Problem List   Diagnosis Date Noted   Essential hypertension 06/28/2024   Hypertensive emergency 06/28/2024   Tachycardia 06/28/2024   Hyperlipidemia 06/28/2024   AKI (acute kidney injury) 06/28/2024   ICH (intracerebral hemorrhage) (HCC) 06/25/2024    ONSET DATE: 06/28/2024 (referral date)   REFERRING DIAG: I61.9 (ICD-10-CM) - Hemorrhagic stroke (HCC)  THERAPY DIAG:  Visuospatial deficit  Rationale for Evaluation and Treatment: Rehabilitation  SUBJECTIVE:   SUBJECTIVE STATEMENT: I did cooking and cleaning last week. I vacuumed and cleaned dishes, fried eggs. All by myself. Everything went well, it's just like I was before. Pt accompanied by: self and daughter  PERTINENT HISTORY: presents to [facility] 10/25 with word-finding difficulties, headache. 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 PMH includes HTN  PRECAUTIONS: Other: no driving  WEIGHT BEARING RESTRICTIONS: No  PAIN:  Are you having pain? No  FALLS: Has patient fallen in last 6 months? No  LIVING ENVIRONMENT: Lives with: lives alone (but currently switching b/t living with daughter and daughter staying with her) Lives in: 2 story house but stays on first  floor, 2 steps to enter (landing b/t) Has following equipment at home: None  PLOF: Independent and Leisure: antiquing/shopping  PATIENT GOALS: return to everything and driving  OBJECTIVE:  Note: Objective measures were completed at Evaluation unless otherwise  noted.  HAND DOMINANCE: Right  ADLs: Overall ADLs: independent Transfers/ambulation related to ADLs: Eating: independent Grooming: independent UB Dressing: independent LB Dressing: independent Toileting: independent Bathing: independent Tub Shower transfers: independent Equipment: none  IADLs: Shopping: has gone with daughter but has not yet done  Light housekeeping: pt has resumed washing dishes but not attempted other cleaning Meal Prep: has not yet attempted - was not much of a cook prior to stroke Community mobility: relies on daughter currently for transportation Medication management: independent Financial management: pt has auto -pay for everything Handwriting: 100% legible  MOBILITY STATUS: Independent    UPPER EXTREMITY ROM:  BUE AROM WNL's   UPPER EXTREMITY MMT:   BUE MMT grossly WNL's - slightly weaker Rt shoulder w/ flexion only  HAND FUNCTION: Grip strength: Right: 46 lbs; Left: 42.5 lbs  COORDINATION: 9 Hole Peg test: Right: 20.13 sec; Left: 25.33 sec  SENSATION: WFL per pt report  EDEMA: none   COGNITION: Overall cognitive status: delayed recall 3/3, spells world forwards and backwards w/o difficulty, subtracts by 7's w/ only 1 error and difficulty (premorbid difficulty as well)  VISION: Subjective report: no visual deficits prior to stroke Baseline vision: Wears glasses for distance only Visual history: none  VISION ASSESSMENT: WFL for OROM, Smooth pursuits, and convergence Rt visual field appears WFL's w/ confrontation testing, however daughter does report Rt lower quadrant loss of vision which may have resolved - pt encouraged to see optometrist to perform perimetry testing   PERCEPTION: Not tested  PRAXIS: Not tested  OBSERVATIONS:  Pt appears to have resolved from most deficits, but has not fully resumed IADLS or driving. No further speech deficits                                                                                                                              TREATMENT DATE: 07/18/24  Pt reports returning to cooking and cleaning since last visit and stated no issues it's like it was before.   While OT set up visual scanning activity, pt completed Bell cancellation test, finding all 35.   Pt next ambulated around gym finding 15 targets placed all throughout clinic. Pt was able to find all 15 (7/7 on left, 8/8 on right), required multiple laps around gym, however. Found 9 in first lap.  Discussed O.T. findings and brief need for O.T. to ensure pt is safe resuming IADLS in prep for living alone again, and if she appears WFL's for environmental scanning in prep for return to driving.   Pt reports she still needs to set up an appt for optometrist, but reports she feels like she is back to PLOF. Pt requested discharge this date but verbalized understanding on how to receive  referral in the future should she require.    PATIENT EDUCATION: Education details: see above Person educated: Patient and Child(ren) Education method: Explanation Education comprehension: verbalized understanding  HOME EXERCISE PROGRAM: N/A   GOALS: Goals reviewed with patient? Yes   LONG TERM GOALS: Target date: 08/05/24  Pt has safely resumed premorbid cooking level and cleaning level independently Baseline: Pt has yet to attempt Goal status:GOAL MET   2.  Pt to perform environmental scanning in mod distracting gym w/ simple physical task at 85% accuracy or greater Baseline: 100% Goal status: GOAL MET    ASSESSMENT:  CLINICAL IMPRESSION: Patient is a 68 y.o. female who was seen today for occupational therapy tx for hemorrhagic stroke. Hx includes HTN. Pt reports returning to PLOF and therefore would like to be discharged this date.     PERFORMANCE DEFICITS: in functional skills including IADLs, endurance, decreased knowledge of precautions, and vision.   IMPAIRMENTS: are limiting patient from IADLs and driving.    CO-MORBIDITIES: may have co-morbidities  that affects occupational performance. Patient will benefit from skilled OT to address above impairments and improve overall function.  MODIFICATION OR ASSISTANCE TO COMPLETE EVALUATION: No modification of tasks or assist necessary to complete an evaluation.  OT OCCUPATIONAL PROFILE AND HISTORY: Detailed assessment: Review of records and additional review of physical, cognitive, psychosocial history related to current functional performance.  CLINICAL DECISION MAKING: LOW - limited treatment options, no task modification necessary  REHAB POTENTIAL: Excellent  EVALUATION COMPLEXITY: Low     Daray Polgar, OT 07/18/2024, 12:20 PM

## 2024-07-20 ENCOUNTER — Ambulatory Visit: Payer: Self-pay | Admitting: Cardiology

## 2024-07-20 ENCOUNTER — Ambulatory Visit (HOSPITAL_COMMUNITY)
Admission: RE | Admit: 2024-07-20 | Discharge: 2024-07-20 | Disposition: A | Discharge status: Home / Self Care | Source: Ambulatory Visit | Attending: Cardiology | Admitting: Cardiology

## 2024-07-20 DIAGNOSIS — I161 Hypertensive emergency: Secondary | ICD-10-CM | POA: Diagnosis present

## 2024-07-20 DIAGNOSIS — I10 Essential (primary) hypertension: Secondary | ICD-10-CM | POA: Insufficient documentation

## 2024-07-26 ENCOUNTER — Ambulatory Visit: Admitting: Occupational Therapy

## 2024-08-09 ENCOUNTER — Ambulatory Visit: Admitting: General Practice

## 2024-08-09 ENCOUNTER — Inpatient Hospital Stay: Admitting: Adult Health

## 2024-08-17 NOTE — Progress Notes (Unsigned)
 [facility] [address] [place]. [place] [address] [phone]       HOSPITAL FOLLOW UP NOTE  [name] Date of Birth: 1956-04-19 Medical Record Number: [id number]   Reason for Referral:  hospital stroke follow up    SUBJECTIVE:   CHIEF COMPLAINT:  No chief complaint on file.   HPI:   Susan Roman is a 68 y.o. who  has a past medical history of AKI (acute kidney injury), GAD (generalized anxiety disorder), HLD (hyperlipidemia), HTN (hypertension), Intracerebral hemorrhage (HCC) (06/25/2024), Obesity, Stroke (HCC) (06/25/2024), and Tachycardia.  Patient presented on 06/25/2024 with expressive aphasia, word finding difficulty and right hemianopsia. CT showed focal hemorrhage in left temporal and parietal lobe. CTA unremarkable. MRI unchanged size of left temporal lobe hemorrhage, small acute infarct medial to the hemorrhage, advanced chronic microvascular ischemic changes, remote corpus callosum and left basal ganglia infarcts, numerous chronic microhemorrhages, likely hypertensive. Metoprolol  25mg  BID added to amlodipine  10mg  daily. LDL 101. Atorvastatin  20mg  started. A1C 5.2. Therapy advised outpatient PT/OT/ST. She was discharged home. Personally reviewed hospitalization pertinent progress notes, lab work and imaging.  Evaluated by Dr Jerri.   Since discharge,   PT/OT/ST  Ophthalmology? Driving?  BP?   PCP?  Cardiology?    PERTINENT IMAGING/LABS  CT head focal hemorrhage in the left temporal and parietal lobe measuring 5.1 mL with surrounding vasogenic edema CTA head & neck no LVO, hemodynamically significant stenosis or aneurysm Follow-up head CT: No change in acute IPH in left temporal lobe MRI unchanged size of left temporal lobe hemorrhage, small acute infarct medial to the hemorrhage, advanced chronic microvascular ischemic changes, remote corpus callosum and left basal ganglia infarcts, numerous chronic microhemorrhages, likely  hypertensive 2D Echo EF 60 to 65%    A1C Lab Results  Component Value Date   HGBA1C 5.2 06/26/2024    Lipid Panel     Component Value Date/Time   CHOL 190 06/26/2024 0343   TRIG 59 06/26/2024 0343   HDL 77 06/26/2024 0343   CHOLHDL 2.5 06/26/2024 0343   VLDL 12 06/26/2024 0343   LDLCALC 101 (H) 06/26/2024 0343      ROS:   14 system review of systems performed and negative with exception of those listed in HPI  PMH:  Past Medical History:  Diagnosis Date   AKI (acute kidney injury)    GAD (generalized anxiety disorder)    HLD (hyperlipidemia)    HTN (hypertension)    Intracerebral hemorrhage (HCC) 06/25/2024   Posterior left temporal and parietal lobe intraparenchymal hemorrhage (5.1 mm)   Obesity    Stroke (HCC) 06/25/2024   MRI of the brain confirmed left temporal lobe hemorrhage with a small acute infarct medial to the hemorrhage.  Also noted:  Advanced chronic microvascular ischemic changes.  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  Numerous chronic microhemorrhages most consistent to longstanding hypertension   Tachycardia     PSH:  Past Surgical History:  Procedure Laterality Date   fibroid tumor     TRANSTHORACIC ECHOCARDIOGRAM  06/26/2024   Normal LV function.  EF 60-65%, normal wall motion, no evidence of myocardial injury, normal ventricular wall thickness, small pericardial effusion, normal mitral and aortic valve function, normal right ventricular and atrial size, normal intracardiac pressures    Social History:  Social History   Socioeconomic History   Marital status: Legally Separated    Spouse name: Not on file   Number of children: Not on file   Years of education:  Not on file   Highest education level: Not on file  Occupational History   Not on file  Tobacco Use   Smoking status: Never   Smokeless tobacco: Not on file  Substance and Sexual Activity   Alcohol use: Not Currently   Drug use: No   Sexual activity: Not  Currently  Other Topics Concern   Not on file  Social History Narrative   Not on file   Social Drivers of Health   Tobacco Use: Unknown (07/14/2024)   Patient History    Smoking Tobacco Use: Never    Smokeless Tobacco Use: Unknown    Passive Exposure: Not on file  Financial Resource Strain: Not on file  Food Insecurity: No Food Insecurity (06/25/2024)   Epic    Worried About Programme Researcher, Broadcasting/film/video in the Last Year: Never true    Ran Out of Food in the Last Year: Never true  Transportation Needs: No Transportation Needs (06/25/2024)   Epic    Lack of Transportation (Medical): No    Lack of Transportation (Non-Medical): No  Physical Activity: Not on file  Stress: Not on file  Social Connections: Moderately Isolated (06/26/2024)   Social Connection and Isolation Panel    Frequency of Communication with Friends and Family: More than three times a week    Frequency of Social Gatherings with Friends and Family: More than three times a week    Attends Religious Services: Never    Database Administrator or Organizations: Yes    Attends Engineer, Structural: More than 4 times per year    Marital Status: Divorced  Intimate Partner Violence: Not At Risk (06/25/2024)   Epic    Fear of Current or Ex-Partner: No    Emotionally Abused: No    Physically Abused: No    Sexually Abused: No  Depression (PHQ2-9): Not on file  Alcohol Screen: Not on file  Housing: Low Risk (06/25/2024)   Epic    Unable to Pay for Housing in the Last Year: No    Number of Times Moved in the Last Year: 0    Homeless in the Last Year: No  Utilities: Not At Risk (06/25/2024)   Epic    Threatened with loss of utilities: No  Health Literacy: Not on file    Family History: No family history on file.  Medications:  Medications Ordered Prior to Encounter[1]  Allergies:  Allergies[2]    OBJECTIVE:  Physical Exam  There were no vitals filed for this visit. There is no height or weight on file to  calculate BMI. No results found.      No data to display           General: well developed, well nourished, seated, in no evident distress Head: head normocephalic and atraumatic.   Neck: supple with no carotid or supraclavicular bruits Cardiovascular: regular rate and rhythm, no murmurs Musculoskeletal: no deformity Skin:  no rash/petichiae Vascular:  Normal pulses all extremities   Neurologic Exam Mental Status: Awake and fully alert.  Fluent speech and language.  Oriented to place and time. Recent and remote memory intact. Attention span, concentration and fund of knowledge appropriate. Mood and affect appropriate.  Cranial Nerves: Fundoscopic exam reveals sharp disc margins. Pupils equal, briskly reactive to light. Extraocular movements full without nystagmus. Visual fields full to confrontation. Hearing intact. Facial sensation intact. Face, tongue, palate moves normally and symmetrically.  Motor: Normal bulk and tone. Normal strength in all tested extremity muscles Sensory.:  intact to touch , pinprick , position and vibratory sensation.  Coordination: Rapid alternating movements normal in all extremities. Finger-to-nose and heel-to-shin performed accurately bilaterally. Gait and Station: Arises from chair without difficulty. Stance is normal. Gait demonstrates normal stride length and balance with ***. Tandem walk and heel toe ***.  Reflexes: 1+ and symmetric.    NIHSS  *** Modified Rankin  ***    ASSESSMENT: Susan Roman is a 68 y.o. year old female with sudden onset expressive aphasia, word finding difficulty and right hemianopsia. Vascular risk factors include HTN, HLD, advanced age.      PLAN:  ICH: Small left temporoparietal ICH, etiology: Likely hypertensive: Residual deficit: ***. Continue No antithrombotic and atorvastatin  20mg  daily for secondary stroke prevention. Discussed secondary stroke prevention measures and importance of close PCP follow up for  aggressive stroke risk factor management. I have gone over the pathophysiology of stroke, warning signs and symptoms, risk factors and their management in some detail with instructions to go to the closest emergency room for symptoms of concern. HTN: BP goal <130/90.  Stable on amlodipine  10mg  daily and metoprolol  25mg  twice daily. Continue to monitor per PCP and cardiology.  HLD: LDL goal <70. Recent LDL 101. Continue atorvastatin  20mg  daily per PCP.  DMII: A1c goal<7.0. Recent A1c 5.2. Not diabetic. Continue healthy, well balanced diet and regular exercise.     Follow up in *** or call earlier if needed   CC:  [facility] provider: Dr. Rosemarie PCP: Pcp, No    I spent *** minutes of face-to-face and non-face-to-face time with patient.  This included previsit chart review including review of recent hospitalization, lab review, study review, order entry, electronic health record documentation, patient education regarding recent stroke including etiology, secondary stroke prevention measures and importance of managing stroke risk factors, residual deficits and typical recovery time and answered all other questions to patient satisfaction   Greig Forbes, [facility]  [facility] [address] [place], [place] [address]  Phone [phone] Fax [phone] Note: This document was prepared with digital dictation and possible smart phrase technology. Any transcriptional errors that result from this process are unintentional.          [1]  Current Outpatient Medications on File Prior to Visit  Medication Sig Dispense Refill   amLODipine  (NORVASC ) 10 MG tablet Take 1 tablet (10 mg total) by mouth daily. 30 tablet 1   atorvastatin  (LIPITOR) 20 MG tablet Take 1 tablet (20 mg total) by mouth daily. 90 tablet 3   carvedilol  (COREG ) 3.125 MG tablet Take 1 tablet (3.125 mg total) by mouth 2 (two) times daily. 180 tablet 3   metoprolol  tartrate (LOPRESSOR ) 25 MG tablet Take 1  tablet (25 mg total) by mouth as needed. For sustain tachycardia (Patient not taking: Reported on 07/18/2024)     No current facility-administered medications on file prior to visit.  [2] No Known Allergies

## 2024-08-17 NOTE — Patient Instructions (Incomplete)

## 2024-08-18 ENCOUNTER — Inpatient Hospital Stay: Admitting: Family Medicine

## 2024-08-18 ENCOUNTER — Ambulatory Visit: Admitting: Family Medicine

## 2024-08-18 ENCOUNTER — Encounter: Payer: Self-pay | Admitting: Family Medicine

## 2024-08-18 VITALS — BP 158/82 | HR 71 | Resp 16 | Ht 62.0 in | Wt 150.0 lb

## 2024-08-18 DIAGNOSIS — I611 Nontraumatic intracerebral hemorrhage in hemisphere, cortical: Secondary | ICD-10-CM

## 2024-08-20 ENCOUNTER — Other Ambulatory Visit: Payer: Self-pay | Admitting: Cardiology

## 2024-08-22 ENCOUNTER — Encounter: Payer: Self-pay | Admitting: Cardiology

## 2024-08-22 ENCOUNTER — Encounter: Payer: Self-pay | Admitting: Family Medicine

## 2024-08-22 MED ORDER — AMLODIPINE BESYLATE 10 MG PO TABS: 10.0000 mg | ORAL_TABLET | Freq: Every day | ORAL | 3 refills | Status: DC

## 2024-08-23 NOTE — Telephone Encounter (Signed)
 Pt called back wanting to know if she would be ok taking a 3hr road trip by herself. Please advise.

## 2024-09-05 ENCOUNTER — Inpatient Hospital Stay: Admitting: Adult Health

## 2024-09-07 ENCOUNTER — Telehealth: Payer: Self-pay | Admitting: *Deleted

## 2024-09-07 NOTE — Telephone Encounter (Signed)
 Received note from [facility] asking for neurology clearance for patient to proceed with scheduling a colonoscopy. OV from [facility] dated 07/14/24. Office note placed in Amy NP's office.

## 2024-09-12 ENCOUNTER — Encounter: Payer: Self-pay | Admitting: *Deleted

## 2024-09-12 NOTE — Telephone Encounter (Addendum)
 I spoke with [facility] @ [facility] [phone] and confirmed this colonoscopy is routine. I informed them the NP recommends to wait at least 3 months after 06/25/24 stroke for colonoscopy. Logan notified Rosina (?) the scheduler and asked for a note to be faxed to them indicating this.   Fax # [phone]

## 2024-09-12 NOTE — Telephone Encounter (Signed)
 Letter has been signed and faxed to [facility]. Received a receipt of confirmation.

## 2024-09-12 NOTE — Telephone Encounter (Signed)
 Letter written and placed in Amy's office for signature.

## 2024-09-15 ENCOUNTER — Encounter: Payer: Self-pay | Admitting: General Practice

## 2024-09-16 ENCOUNTER — Ambulatory Visit: Admitting: General Practice

## 2024-09-16 ENCOUNTER — Ambulatory Visit: Payer: Self-pay | Admitting: General Practice

## 2024-09-16 VITALS — BP 130/80 | HR 76 | Temp 97.9°F | Ht 62.0 in | Wt 147.0 lb

## 2024-09-16 DIAGNOSIS — I10 Essential (primary) hypertension: Secondary | ICD-10-CM | POA: Diagnosis not present

## 2024-09-16 DIAGNOSIS — I611 Nontraumatic intracerebral hemorrhage in hemisphere, cortical: Secondary | ICD-10-CM | POA: Diagnosis not present

## 2024-09-16 DIAGNOSIS — Z1159 Encounter for screening for other viral diseases: Secondary | ICD-10-CM | POA: Diagnosis not present

## 2024-09-16 DIAGNOSIS — Z09 Encounter for follow-up examination after completed treatment for conditions other than malignant neoplasm: Secondary | ICD-10-CM | POA: Diagnosis not present

## 2024-09-16 DIAGNOSIS — Z7689 Persons encountering health services in other specified circumstances: Secondary | ICD-10-CM | POA: Insufficient documentation

## 2024-09-16 DIAGNOSIS — E782 Mixed hyperlipidemia: Secondary | ICD-10-CM

## 2024-09-16 LAB — COMPREHENSIVE METABOLIC PANEL WITH GFR
ALT: 12 U/L (ref 3–35)
AST: 16 U/L (ref 5–37)
Albumin: 4.2 g/dL (ref 3.5–5.2)
Alkaline Phosphatase: 75 U/L (ref 39–117)
BUN: 13 mg/dL (ref 6–23)
CO2: 31 meq/L (ref 19–32)
Calcium: 9.2 mg/dL (ref 8.4–10.5)
Chloride: 104 meq/L (ref 96–112)
Creatinine, Ser: 0.8 mg/dL (ref 0.40–1.20)
GFR: 75.42 mL/min
Glucose, Bld: 98 mg/dL (ref 70–99)
Potassium: 4.3 meq/L (ref 3.5–5.1)
Sodium: 141 meq/L (ref 135–145)
Total Bilirubin: 0.4 mg/dL (ref 0.2–1.2)
Total Protein: 6.9 g/dL (ref 6.0–8.3)

## 2024-09-16 LAB — LIPID PANEL
Cholesterol: 138 mg/dL (ref 28–200)
HDL: 58 mg/dL
LDL Cholesterol: 63 mg/dL (ref 10–99)
NonHDL: 80.43
Total CHOL/HDL Ratio: 2
Triglycerides: 89 mg/dL (ref 10.0–149.0)
VLDL: 17.8 mg/dL (ref 0.0–40.0)

## 2024-09-16 LAB — CBC
HCT: 40.8 % (ref 36.0–46.0)
Hemoglobin: 13.7 g/dL (ref 12.0–15.0)
MCHC: 33.6 g/dL (ref 30.0–36.0)
MCV: 92.5 fl (ref 78.0–100.0)
Platelets: 344 K/uL (ref 150.0–400.0)
RBC: 4.41 Mil/uL (ref 3.87–5.11)
RDW: 12.4 % (ref 11.5–15.5)
WBC: 5 K/uL (ref 4.0–10.5)

## 2024-09-16 LAB — TSH: TSH: 1.5 u[IU]/mL (ref 0.35–5.50)

## 2024-09-16 NOTE — Assessment & Plan Note (Signed)
 EMR reviewed briefly.

## 2024-09-16 NOTE — Progress Notes (Signed)
 "  New Patient Office Visit  Subjective    Patient ID: Susan Roman, female    DOB: 20-May-1956  Age: 69 y.o. MRN: [id number]  CC:  Chief Complaint  Patient presents with   New Patient (Initial Visit)    Establish care   Hospitalization Follow-up    From ER visit in October; stroke. Discuss when its okay to have colonoscopy done after stroke.     HPI Susan Roman is a 69 y.o. female presents to establish care.  Previous PCP/physical/labs: [facility] several years ago. No recent physical.   Discussed the use of AI scribe software for clinical note transcription with the patient, who gave verbal consent to proceed.  History of Present Illness Susan Roman is a 69 year old female with a history of hemorrhagic stroke who presents for follow-up care.  She experienced a hemorrhagic stroke on June 25, 2024, resulting in a three-day hospitalization. Prior to the stroke, she had well-controlled hypertension but had discontinued her medication without medical supervision. Post-stroke, she initially had difficulty speaking, particularly with numbers, which improved significantly after two and a half days. No one-sided body weakness or facial drooping has been noted post-stroke.  Her current medications include metoprolol  as needed, carvedilol  3.125 mg twice daily, atorvastatin  for cholesterol management, and amlodipine  10 mg once daily. She monitors her blood pressure at home, which was 124/78 mmHg this morning.  She experienced an eye issue post-stroke, which has since resolved after consulting an eye doctor. She also underwent occupational therapy, attending two to three sessions, which have concluded.  She has not yet undergone a colonoscopy, which was postponed due to the stroke. Communication is ongoing between her healthcare providers to determine the appropriate timing for this procedure.  Her family history includes a relative who was bedridden for ten years  following a stroke before passing away in his early 24. She travels frequently to visit her daughter in [place].     Outpatient Encounter Medications as of 09/16/2024  Medication Sig   amLODipine  (NORVASC ) 10 MG tablet Take 1 tablet by mouth daily.   atorvastatin  (LIPITOR) 20 MG tablet Take 1 tablet (20 mg total) by mouth daily.   carvedilol  (COREG ) 3.125 MG tablet Take 1 tablet (3.125 mg total) by mouth 2 (two) times daily.   [DISCONTINUED] amLODipine  (NORVASC ) 10 MG tablet Take 1 tablet (10 mg total) by mouth daily.   No facility-administered encounter medications on file as of 09/16/2024.    Past Medical History:  Diagnosis Date   AKI (acute kidney injury)    Allergy    Cataract    GAD (generalized anxiety disorder)    HLD (hyperlipidemia)    HTN (hypertension)    Intracerebral hemorrhage (HCC) 06/25/2024   Posterior left temporal and parietal lobe intraparenchymal hemorrhage (5.1 mm)   Obesity    Stroke [facility])    MRI of the brain confirmed left temporal lobe hemorrhage with a small acute infarct medial to the hemorrhage.  Also noted:  Advanced chronic microvascular ischemic changes.  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  Numerous chronic microhemorrhages most consistent to longstanding hypertension   Tachycardia     Past Surgical History:  Procedure Laterality Date   fibroid tumor     TRANSTHORACIC ECHOCARDIOGRAM  06/26/2024   Normal LV function.  EF 60-65%, normal wall motion, no evidence of myocardial injury, normal ventricular wall thickness, small pericardial effusion, normal mitral and aortic valve function, normal right ventricular and atrial size,  normal intracardiac pressures   TUBAL LIGATION      Family History  Problem Relation Age of Onset   Arrhythmia Mother        Afib   Arrhythmia Father        Hypertension, heart attack   Heart attack Father    Arrhythmia Maternal Uncle        A fib    Social History   Socioeconomic  History   Marital status: Legally Separated    Spouse name: Not on file   Number of children: Not on file   Years of education: Not on file   Highest education level: 12th grade  Occupational History   Not on file  Tobacco Use   Smoking status: Never   Smokeless tobacco: Not on file  Vaping Use   Vaping status: Never Used  Substance and Sexual Activity   Alcohol use: Never   Drug use: Never   Sexual activity: Not Currently    Birth control/protection: Post-menopausal  Other Topics Concern   Not on file  Social History Narrative   Not on file   Social Drivers of Health   Tobacco Use: Unknown (09/16/2024)   Patient History    Smoking Tobacco Use: Never    Smokeless Tobacco Use: Unknown    Passive Exposure: Not on file  Financial Resource Strain: Patient Declined (09/16/2024)   Overall Financial Resource Strain (CARDIA)    Difficulty of Paying Living Expenses: Patient declined  Food Insecurity: Patient Declined (09/16/2024)   Epic    Worried About Programme Researcher, Broadcasting/film/video in the Last Year: Patient declined    Barista in the Last Year: Patient declined  Transportation Needs: No Transportation Needs (09/16/2024)   Epic    Lack of Transportation (Medical): No    Lack of Transportation (Non-Medical): No  Physical Activity: Inactive (09/16/2024)   Exercise Vital Sign    Days of Exercise per Week: 0 days    Minutes of Exercise per Session: Not on file  Stress: No Stress Concern Present (09/16/2024)   [employer] of Occupational Health - Occupational Stress Questionnaire    Feeling of Stress: Not at all  Social Connections: Unknown (09/16/2024)   Social Connection and Isolation Panel    Frequency of Communication with Friends and Family: Patient declined    Frequency of Social Gatherings with Friends and Family: Patient declined    Attends Religious Services: Patient declined    Database Administrator or Organizations: Patient declined    Attends Hospital Doctor: Not on file    Marital Status: Divorced  Recent Concern: Social Connections - Moderately Isolated (06/26/2024)   Social Connection and Isolation Panel    Frequency of Communication with Friends and Family: More than three times a week    Frequency of Social Gatherings with Friends and Family: More than three times a week    Attends Religious Services: Never    Database Administrator or Organizations: Yes    Attends Banker Meetings: More than 4 times per year    Marital Status: Divorced  Intimate Partner Violence: Not At Risk (06/25/2024)   Epic    Fear of Current or Ex-Partner: No    Emotionally Abused: No    Physically Abused: No    Sexually Abused: No  Depression (PHQ2-9): Low Risk (09/16/2024)   Depression (PHQ2-9)    PHQ-2 Score: 0  Alcohol Screen: Not on file  Housing: Unknown (09/16/2024)   Epic  Unable to Pay for Housing in the Last Year: No    Number of Times Moved in the Last Year: Not on file    Homeless in the Last Year: No  Utilities: Not At Risk (06/25/2024)   Epic    Threatened with loss of utilities: No  Health Literacy: Not on file    Review of Systems  Constitutional:  Negative for chills and fever.  Respiratory:  Negative for shortness of breath.   Cardiovascular:  Negative for chest pain.  Gastrointestinal:  Negative for abdominal pain, constipation, diarrhea, heartburn, nausea and vomiting.  Genitourinary:  Negative for dysuria, frequency and urgency.  Neurological:  Negative for dizziness and headaches.  Endo/Heme/Allergies:  Negative for polydipsia.  Psychiatric/Behavioral:  Negative for depression and suicidal ideas. The patient is not nervous/anxious.         Objective    BP 130/80   Pulse 76   Temp 97.9 F (36.6 C) (Temporal)   Ht 5' 2 (1.575 m)   Wt 147 lb (66.7 kg)   SpO2 99%   BMI 26.89 kg/m   Physical Exam Vitals and nursing note reviewed.  Constitutional:      Appearance: Normal appearance.   Cardiovascular:     Rate and Rhythm: Normal rate and regular rhythm.     Pulses: Normal pulses.     Heart sounds: Normal heart sounds.  Pulmonary:     Effort: Pulmonary effort is normal.     Breath sounds: Normal breath sounds.  Neurological:     Mental Status: She is alert and oriented to person, place, and time.  Psychiatric:        Mood and Affect: Mood normal.        Behavior: Behavior normal.        Thought Content: Thought content normal.        Judgment: Judgment normal.         Assessment & Plan:  Essential hypertension -     CBC -     Comprehensive metabolic panel with GFR  Establishing care with new doctor, encounter for Assessment & Plan: EMR reviewed briefly.     Hospital discharge follow-up Assessment & Plan: Reviewed hospital notes, labs, imaging.   Nontraumatic cortical hemorrhage of left cerebral hemisphere [facility])  Mixed hyperlipidemia -     Lipid panel -     TSH  Need for hepatitis C screening test -     Hepatitis C antibody    Assessment and Plan Assessment & Plan Nontraumatic intracerebral hemorrhage, left hemisphere Hemorrhagic stroke on October 25th, 2025, with no residual deficits. Neurological recovery ongoing. -  Reviewed neurology, cardiology notes.  - neuro exam stable today. - Continue follow-up with neurology and cardiology. - Monitor blood pressure at home and report if it exceeds 130/90 mmHg.  Essential hypertension Hypertension managed with metoprolol , carvedilol , atorvastatin , and amlodipine . Blood pressure well-controlled. Emphasized medication adherence to prevent stroke recurrence. - Continue current antihypertensive regimen:  carvedilol  twice daily, atorvastatin , and amlodipine  once daily. - Monitor blood pressure at home and report if it exceeds 130/90 mmHg. - Following with cardiology.  Mixed hyperlipidemia Managed with atorvastatin . Cholesterol levels to be rechecked. - Continue atorvastatin  for cholesterol  management. - Lipid panel pending.   Return in about 4 weeks (around 10/14/2024) for physical.   Carrol Aurora, NP  "

## 2024-09-16 NOTE — Patient Instructions (Signed)
 Stop by the lab prior to leaving today. I will notify you of your results once received.   Follow up in 4 weeks for physical.   It was a pleasure to meet you today! Please don't hesitate to contact me with any questions. Welcome to [facility]!

## 2024-09-16 NOTE — Assessment & Plan Note (Signed)
Reviewed hospital notes, labs, imaging.

## 2024-09-18 LAB — HEPATITIS C ANTIBODY: Hepatitis C Ab: NONREACTIVE

## 2024-10-20 ENCOUNTER — Encounter: Admitting: General Practice

## 2024-11-09 ENCOUNTER — Ambulatory Visit: Admitting: Cardiology

## 2025-02-13 ENCOUNTER — Ambulatory Visit: Admitting: Family Medicine
